# Patient Record
Sex: Male | Born: 1940 | Race: White | Hispanic: No | Marital: Married | Smoking: Never smoker
Health system: Southern US, Community
[De-identification: ages and names within clinical notes are randomized; demographics above are authoritative.]

## PROBLEM LIST (undated history)

## (undated) DIAGNOSIS — F32A Depression, unspecified: Secondary | ICD-10-CM

## (undated) DIAGNOSIS — F419 Anxiety disorder, unspecified: Secondary | ICD-10-CM

## (undated) DIAGNOSIS — G20A1 Parkinson's disease without dyskinesia, without mention of fluctuations: Secondary | ICD-10-CM

## (undated) DIAGNOSIS — F329 Major depressive disorder, single episode, unspecified: Secondary | ICD-10-CM

## (undated) DIAGNOSIS — G20 Parkinson's disease: Secondary | ICD-10-CM

## (undated) DIAGNOSIS — I10 Essential (primary) hypertension: Secondary | ICD-10-CM

## (undated) HISTORY — DX: Major depressive disorder, single episode, unspecified: F32.9

## (undated) HISTORY — PX: BACK SURGERY: SHX140

## (undated) HISTORY — PX: HERNIA REPAIR: SHX51

## (undated) HISTORY — DX: Depression, unspecified: F32.A

## (undated) HISTORY — DX: Essential (primary) hypertension: I10

## (undated) HISTORY — DX: Anxiety disorder, unspecified: F41.9

---

## 2000-01-26 ENCOUNTER — Encounter: Payer: Self-pay | Admitting: General Surgery

## 2000-01-26 ENCOUNTER — Encounter: Admission: RE | Admit: 2000-01-26 | Discharge: 2000-01-26 | Payer: Self-pay | Admitting: General Surgery

## 2000-02-02 ENCOUNTER — Ambulatory Visit (HOSPITAL_BASED_OUTPATIENT_CLINIC_OR_DEPARTMENT_OTHER): Admission: RE | Admit: 2000-02-02 | Discharge: 2000-02-02 | Payer: Self-pay | Admitting: General Surgery

## 2002-02-16 ENCOUNTER — Ambulatory Visit (HOSPITAL_COMMUNITY): Admission: RE | Admit: 2002-02-16 | Discharge: 2002-02-16 | Payer: Self-pay | Admitting: Gastroenterology

## 2002-03-07 ENCOUNTER — Encounter: Payer: Self-pay | Admitting: Cardiology

## 2002-03-07 ENCOUNTER — Ambulatory Visit (HOSPITAL_COMMUNITY): Admission: RE | Admit: 2002-03-07 | Discharge: 2002-03-07 | Payer: Self-pay | Admitting: Cardiology

## 2006-06-23 ENCOUNTER — Emergency Department (HOSPITAL_COMMUNITY): Admission: EM | Admit: 2006-06-23 | Discharge: 2006-06-23 | Payer: Self-pay | Admitting: Emergency Medicine

## 2007-11-16 ENCOUNTER — Encounter: Admission: RE | Admit: 2007-11-16 | Discharge: 2007-11-16 | Payer: Self-pay | Admitting: Family Medicine

## 2009-03-29 ENCOUNTER — Emergency Department (HOSPITAL_COMMUNITY): Admission: EM | Admit: 2009-03-29 | Discharge: 2009-03-29 | Payer: Self-pay | Admitting: Emergency Medicine

## 2010-11-15 LAB — URINALYSIS, ROUTINE W REFLEX MICROSCOPIC
Bilirubin Urine: NEGATIVE
Glucose, UA: NEGATIVE mg/dL
Ketones, ur: NEGATIVE mg/dL
Nitrite: NEGATIVE
Protein, ur: NEGATIVE mg/dL
Specific Gravity, Urine: 1.017 (ref 1.005–1.030)
Urobilinogen, UA: 0.2 mg/dL (ref 0.0–1.0)
pH: 6 (ref 5.0–8.0)

## 2010-11-15 LAB — URINE MICROSCOPIC-ADD ON

## 2010-12-26 NOTE — Op Note (Signed)
[place]  Patient:    Troy Cox, Troy Cox                          MRN: [id number] Proc. Date: 02/02/00 Attending:  Jimmye Norman, M.D.                           Operative Report  PREOPERATIVE DIAGNOSIS:  Bilateral inguinal hernias.  POSTOPERATIVE DIAGNOSIS:  Bilateral direct inguinal hernias.  PROCEDURE:  Bilateral laparoscopic inguinal herniorrhaphies with Marlex mesh.  SURGEON:  Jimmye Norman, M.D.  ASSISTANT:  Dr. ______.  ANESTHESIA:  General endotracheal.  ESTIMATED BLOOD LOSS:  Less than 30 cc.  COMPLICATIONS:  None.  CONDITION:  Stable.  INDICATIONS:  The patient is a 70 year old gentleman known to have a bulge in his right groin and also in his left groin on recent examination, who now comes in for a bilateral laparoscopic inguinal herniorrhaphy.  FINDINGS:  The patient had bilateral inguinal hernias, both of direct type with large defects that were covered with mesh.  OPERATION:  The patient was taken to the operating room, placed on table in supine position.  After an adequate general endotracheal anesthetic was administered, he was prepped and draped in usual sterile manner exposing the midline of the abdomen.  A transverse incision was made at the level of the umbilicus down through the subcu, the anterior rectus sheath and then we split the rectus muscle down to the posterior rectus sheath.  We subsequently used a balloon dissector to pass along the posterior rectus sheath down to the pubic tubercle on the right side.  We insufflated the balloon using the ______ device and as we were doing so, could see the dissection plane between the peritoneum and the anterior abdominal wall.  The preperitoneal space was opened up nicely as we could see Coopers ligament on both sides.  We subsequently removed the dissecting balloon and placed an ______ balloon catheter into the preperitoneal space, injected its retention balloon and then  insufflated with carbon dioxide gas.  We used the laparoscope with attached camera and light source to confirm we were in adequate preperitoneal space and we were.  Subsequently, a 5 mm cannula was placed in the midline between the umbilicus and the pubic crest and then a suprapubic cannula of the same size passed. These were both done under direct vision.  Once ______ were in place, the patient was placed in Trendelenburg position and the dissection was begun.  The hernia sac could be seen tented up against the peritoneum.  We dissected off this peritoneum and the hernia sac ______ protruding back into its large direct space.  We subsequently dissected out the spermatic cord on the right side, identifying its structures laterally.  Coopers ligament was adequately dissected out.  Once we had this dissection performed, we cut out a large piece of mesh measuring approximately 5 to 5.5 cm x 4.5 cm in an oval shaped pattern.  We actually dissected out both hernia sacs and hernia defects prior to placing any mesh.  The left side was dissected out in a manner similar to the right.  There was no peritoneal tearing.  We did strip down the peritoneum from the spermatic cord bilaterally and we encircled both spermatic cords completely.  We used the pieces of mesh, which were approximately from 5.5 x 4.5 cm in size onto and above the defects anteriorly and superiorly.  We tacked it down using a circular tacking stapler.  It was tacked down at the symphysis pubis, at [facility] ligament and then anterior abdominal wall above the level of the defect.  Laterally, it was taken up high with a single stapler.  Tacking on both sides was similar.  We irrigated with a small amount of saline.  However, we did soak the mesh in antibiotic solution prior to cutting it out and placing it in the patient.  Once both pieces of mesh were tacked in place we observed as we let the carbon dioxide gas fall out, there was  no actual bulge subsequently.  The mesh laid down perfectly.  We removed all cannulae and gas.  We repaired the periumbilical fascial defect using a running 0 Vicryl stitch.  Once this was done, we injected all incisions with 0.25% Marcaine with epinephrine and then subcuticular 5-0 Vicryl was used to close the skin.  All needle counts, sponge counts and instrument counts were correct.  Sterile dressings were applied. DD:  02/02/00 TD:  02/03/00 Job: [id number]

## 2010-12-26 NOTE — Procedures (Signed)
[place]. [facility]  Patient:    Troy Cox, Troy Cox Visit Number: [id number] MRN: [id number]          Service Type: Attending:  Verlin Grills, M.D. Dictated by:   Verlin Grills, M.D. Proc. Date: 02/16/02   CC:         Abran Cantor. Clovis Riley, M.D. Lupe Carney)   Procedure Report  REFERRING PHYSICIAN:  Abran Cantor. Clovis Riley, M.D.  PROCEDURE:  Colonoscopy.  PROCEDURE INDICATION:  The patient is a 70 year old male born 04-04-1941. The patient is undergoing diagnostic colonoscopy to evaluate guaiac-positive stool.  ENDOSCOPIST:  Verlin Grills, M.D.  PREMEDICATION: 1. Versed 7.5 mg. 2. Fentanyl 50 mcg.  ENDOSCOPE:  Olympus pediatric colonoscope.  DESCRIPTION OF PROCEDURE:  After obtaining informed consent, the patient was placed in the left lateral decubitus position.  I administered intravenous fentanyl and intravenous Versed to achieve conscious sedation for the procedure.  The patients blood pressure, oxygen saturation and cardiac rhythm were monitored throughout the procedure; documented in the medical record.  Anal inspection was normal.  Digital rectal exam revealed an enlarged but nonnodular prostate.  The Olympus pediatric video colonoscope was introduced into the rectum and easily advanced to the cecum.  A normal-appearing ileocecal valve was intubated and the distal ileum inspected.  Colonic preparation for the exam today was excellent.  Rectum normal.  Sigmoid colon and descending colon normal.  Splenic flexure normal.  Transverse colon normal.  Hepatic flexure normal.  Ascending colon normal.  Cecum and ileocecal valve normal.  Distal ileum normal.  ASSESSMENT:  Normal proctocolonoscopy to the cecum, with ileocecal valve intubation and distal ileal inspection. Dictated by:   Verlin Grills, M.D. Attending:  Verlin Grills, M.D. DD:  02/16/02 TD:  02/20/02 Job: [id number]

## 2011-12-31 DIAGNOSIS — B351 Tinea unguium: Secondary | ICD-10-CM | POA: Diagnosis not present

## 2011-12-31 DIAGNOSIS — Z1211 Encounter for screening for malignant neoplasm of colon: Secondary | ICD-10-CM | POA: Diagnosis not present

## 2011-12-31 DIAGNOSIS — Z79899 Other long term (current) drug therapy: Secondary | ICD-10-CM | POA: Diagnosis not present

## 2011-12-31 DIAGNOSIS — D1800 Hemangioma unspecified site: Secondary | ICD-10-CM | POA: Diagnosis not present

## 2011-12-31 DIAGNOSIS — M159 Polyosteoarthritis, unspecified: Secondary | ICD-10-CM | POA: Diagnosis not present

## 2011-12-31 DIAGNOSIS — E782 Mixed hyperlipidemia: Secondary | ICD-10-CM | POA: Diagnosis not present

## 2011-12-31 DIAGNOSIS — Z0000 Encounter for general adult medical examination without abnormal findings: Secondary | ICD-10-CM | POA: Diagnosis not present

## 2011-12-31 DIAGNOSIS — Z125 Encounter for screening for malignant neoplasm of prostate: Secondary | ICD-10-CM | POA: Diagnosis not present

## 2012-01-07 DIAGNOSIS — D239 Other benign neoplasm of skin, unspecified: Secondary | ICD-10-CM | POA: Diagnosis not present

## 2012-01-07 DIAGNOSIS — D1801 Hemangioma of skin and subcutaneous tissue: Secondary | ICD-10-CM | POA: Diagnosis not present

## 2012-01-07 DIAGNOSIS — D1800 Hemangioma unspecified site: Secondary | ICD-10-CM | POA: Diagnosis not present

## 2012-05-05 DIAGNOSIS — Z23 Encounter for immunization: Secondary | ICD-10-CM | POA: Diagnosis not present

## 2012-06-09 DIAGNOSIS — K5730 Diverticulosis of large intestine without perforation or abscess without bleeding: Secondary | ICD-10-CM | POA: Diagnosis not present

## 2012-06-09 DIAGNOSIS — D126 Benign neoplasm of colon, unspecified: Secondary | ICD-10-CM | POA: Diagnosis not present

## 2012-06-09 DIAGNOSIS — Z1211 Encounter for screening for malignant neoplasm of colon: Secondary | ICD-10-CM | POA: Diagnosis not present

## 2013-01-04 DIAGNOSIS — E782 Mixed hyperlipidemia: Secondary | ICD-10-CM | POA: Diagnosis not present

## 2013-01-04 DIAGNOSIS — M159 Polyosteoarthritis, unspecified: Secondary | ICD-10-CM | POA: Diagnosis not present

## 2013-01-04 DIAGNOSIS — Z23 Encounter for immunization: Secondary | ICD-10-CM | POA: Diagnosis not present

## 2013-01-04 DIAGNOSIS — R030 Elevated blood-pressure reading, without diagnosis of hypertension: Secondary | ICD-10-CM | POA: Diagnosis not present

## 2013-01-04 DIAGNOSIS — Z79899 Other long term (current) drug therapy: Secondary | ICD-10-CM | POA: Diagnosis not present

## 2013-01-04 DIAGNOSIS — Z0000 Encounter for general adult medical examination without abnormal findings: Secondary | ICD-10-CM | POA: Diagnosis not present

## 2013-01-04 DIAGNOSIS — H9190 Unspecified hearing loss, unspecified ear: Secondary | ICD-10-CM | POA: Diagnosis not present

## 2013-04-05 DIAGNOSIS — H9190 Unspecified hearing loss, unspecified ear: Secondary | ICD-10-CM | POA: Diagnosis not present

## 2013-04-05 DIAGNOSIS — I10 Essential (primary) hypertension: Secondary | ICD-10-CM | POA: Diagnosis not present

## 2013-04-05 DIAGNOSIS — R498 Other voice and resonance disorders: Secondary | ICD-10-CM | POA: Diagnosis not present

## 2013-04-11 DIAGNOSIS — H903 Sensorineural hearing loss, bilateral: Secondary | ICD-10-CM | POA: Diagnosis not present

## 2013-04-21 DIAGNOSIS — H903 Sensorineural hearing loss, bilateral: Secondary | ICD-10-CM | POA: Diagnosis not present

## 2013-05-31 DIAGNOSIS — Z23 Encounter for immunization: Secondary | ICD-10-CM | POA: Diagnosis not present

## 2014-01-19 DIAGNOSIS — Z0000 Encounter for general adult medical examination without abnormal findings: Secondary | ICD-10-CM | POA: Diagnosis not present

## 2014-01-19 DIAGNOSIS — E782 Mixed hyperlipidemia: Secondary | ICD-10-CM | POA: Diagnosis not present

## 2014-01-19 DIAGNOSIS — I10 Essential (primary) hypertension: Secondary | ICD-10-CM | POA: Diagnosis not present

## 2014-01-19 DIAGNOSIS — M159 Polyosteoarthritis, unspecified: Secondary | ICD-10-CM | POA: Diagnosis not present

## 2014-01-19 DIAGNOSIS — Z23 Encounter for immunization: Secondary | ICD-10-CM | POA: Diagnosis not present

## 2014-05-17 DIAGNOSIS — Z23 Encounter for immunization: Secondary | ICD-10-CM | POA: Diagnosis not present

## 2015-01-21 DIAGNOSIS — E780 Pure hypercholesterolemia: Secondary | ICD-10-CM | POA: Diagnosis not present

## 2015-01-21 DIAGNOSIS — Z0000 Encounter for general adult medical examination without abnormal findings: Secondary | ICD-10-CM | POA: Diagnosis not present

## 2015-01-21 DIAGNOSIS — M150 Primary generalized (osteo)arthritis: Secondary | ICD-10-CM | POA: Diagnosis not present

## 2015-01-21 DIAGNOSIS — I10 Essential (primary) hypertension: Secondary | ICD-10-CM | POA: Diagnosis not present

## 2015-06-06 DIAGNOSIS — Z23 Encounter for immunization: Secondary | ICD-10-CM | POA: Diagnosis not present

## 2016-02-10 DIAGNOSIS — K219 Gastro-esophageal reflux disease without esophagitis: Secondary | ICD-10-CM | POA: Diagnosis not present

## 2016-02-10 DIAGNOSIS — R490 Dysphonia: Secondary | ICD-10-CM | POA: Diagnosis not present

## 2016-02-10 DIAGNOSIS — D489 Neoplasm of uncertain behavior, unspecified: Secondary | ICD-10-CM | POA: Diagnosis not present

## 2016-02-10 DIAGNOSIS — R079 Chest pain, unspecified: Secondary | ICD-10-CM | POA: Diagnosis not present

## 2016-02-10 DIAGNOSIS — M150 Primary generalized (osteo)arthritis: Secondary | ICD-10-CM | POA: Diagnosis not present

## 2016-02-10 DIAGNOSIS — Z0000 Encounter for general adult medical examination without abnormal findings: Secondary | ICD-10-CM | POA: Diagnosis not present

## 2016-02-10 DIAGNOSIS — E7800 Pure hypercholesterolemia, unspecified: Secondary | ICD-10-CM | POA: Diagnosis not present

## 2016-02-10 DIAGNOSIS — I10 Essential (primary) hypertension: Secondary | ICD-10-CM | POA: Diagnosis not present

## 2016-02-21 DIAGNOSIS — L820 Inflamed seborrheic keratosis: Secondary | ICD-10-CM | POA: Diagnosis not present

## 2016-04-28 DIAGNOSIS — Z23 Encounter for immunization: Secondary | ICD-10-CM | POA: Diagnosis not present

## 2016-08-18 DIAGNOSIS — H2513 Age-related nuclear cataract, bilateral: Secondary | ICD-10-CM | POA: Diagnosis not present

## 2016-09-10 DIAGNOSIS — G20 Parkinson's disease: Secondary | ICD-10-CM | POA: Diagnosis not present

## 2016-09-24 ENCOUNTER — Ambulatory Visit: Payer: Medicare Other | Attending: Pharmacist | Admitting: Physical Therapy

## 2016-09-24 DIAGNOSIS — R2689 Other abnormalities of gait and mobility: Secondary | ICD-10-CM | POA: Insufficient documentation

## 2016-09-24 DIAGNOSIS — R293 Abnormal posture: Secondary | ICD-10-CM | POA: Diagnosis not present

## 2016-09-24 DIAGNOSIS — R2681 Unsteadiness on feet: Secondary | ICD-10-CM | POA: Insufficient documentation

## 2016-09-24 NOTE — Therapy (Signed)
[facility] [address] [place], [place], [address] Phone: [phone]   Fax:  [phone]  Physical Therapy Evaluation  Patient Details  Name: Troy Cox MRN: [id number] Date of Birth: 11/28/1940 Referring Provider: Sanjay, Iyer ([facility])  Encounter Date: 09/24/2016              PT End of Session - 09/24/16 2214    Visit Number 1   Number of Visits 16   Date for PT Re-Evaluation 11/23/16   Authorization Type Medicare primary; [employer] 2nd; (GCODE every 10th visit)   PT Start Time 0804   PT Stop Time 0852   PT Time Calculation (min) 48 min   Activity Tolerance Patient tolerated treatment well   Behavior During Therapy WFL for tasks assessed/performed      No past medical history on file.  No past surgical history on file.  There were no vitals filed for this visit.               Subjective Assessment - 09/24/16 0807    Subjective Pt reports noticing several physical symptoms, including hand trembling (approx 1 year ago); he also notes slowed walking, forward posture, history of back pain.  He has used cane for approx 1 month.  Pt has had one fall (forward, trying to get the newspaper)   Patient is accompained by: Family member  wife, Jeannee   Pertinent History history of back pain   Patient Stated Goals Pt's goal for therapy is to improve walking.   Currently in Pain? Yes   Pain Score 3    Pain Location Back   Pain Orientation Lower   Pain Descriptors / Indicators --  consistent   Pain Type Chronic pain   Pain Frequency Constant   Aggravating Factors  unsure what aggravates   Pain Relieving Factors Medications alleviate    PT will monitor pain, but will not address as a goal at this time, as pain is chronic in nature.              [facility] PT Assessment - 09/24/16 0819            Assessment   Medical Diagnosis Parkinson's disease   Referring  Provider Sanjay, Iyer  [facility]   Onset Date/Surgical Date 09/11/16       Precautions   Precautions Fall       Balance Screen   Has the patient fallen in the past 6 months Yes   How many times? 1   Has the patient had a decrease in activity level because of a fear of falling?  No   Is the patient reluctant to leave their home because of a fear of falling?  No       Home Environment   Living Environment Private residence   Living Arrangements Spouse/significant other   Type of Home House   Home Access Stairs to enter   Entrance Stairs-Number of Steps 5   Entrance Stairs-Rails Right   Home Layout Multi-level  Office on top floor   Home Equipment Cane - single point       Prior Function   Level of Independence Independent with community mobility without device;Independent with household mobility without device  Needs assist with coats   Leisure Enjoys walking in neighborhood, yardwork       Observation/Other Assessments   Focus on Therapeutic Outcomes (FOTO)  NA       Posture/Postural Control   Posture/Postural Control   Postural limitations   Postural Limitations Rounded Shoulders;Forward head       Tone   Assessment Location Right Lower Extremity       ROM / Strength   AROM / PROM / Strength AROM;Strength       AROM   Overall AROM Comments WFL, with possible hamstring tightness noted with upright standing       Strength   Overall Strength Comments Grossly tested at least 4 to 5/5 bilateral lower extremities-hip flexion, quads, hamstrings, ankle dorsiflexion       Transfers   Transfers Sit to Stand;Stand to Sit   Sit to Stand 5: Supervision;Without upper extremity assist;From chair/3-in-1   Five time sit to stand comments  28.22   Stand to Sit 5: Supervision;Without upper extremity assist;To chair/3-in-1   Comments Upon standing, pt does not fully extend knees, has forward flexed posture       Ambulation/Gait    Ambulation/Gait Yes   Ambulation/Gait Assistance 5: Supervision;4: Min guard   Ambulation/Gait Assistance Details Pt appears to have several episodes of whole body type tremor, with LOB upon standing, needing therapist's assist to regain balance.   Ambulation Distance (Feet) 200 Feet   Assistive device Straight cane  but does not use consistently during eval   Gait Pattern Step-through pattern;Decreased arm swing - right;Decreased step length - right;Shuffle;Trendelenburg;Lateral trunk lean to right;Decreased trunk rotation   Ambulation Surface Level;Indoor   Gait velocity 14.94 sec (2.2 ft/sec)  holds cane, 15.75 sec (2.08 ft/sec)no device       Standardized Balance Assessment   Standardized Balance Assessment Timed Up and Go Test       Timed Up and Go Test   Normal TUG (seconds) 15.97   Manual TUG (seconds) 15.13   Cognitive TUG (seconds) 14.03   TUG Comments Scores >13.5-15 sec indicate increased fall risk.       High Level Balance   High Level Balance Comments Initiated MiniBESTest, but did not complete due to time constraints of eval.  Posterior push and release:  pt takes 2 small steps to recover.  Forward push and release-pt has extended time of lean forward, but does not take recovery step.       RLE Tone   RLE Tone Mild                  [facility] Adult PT Treatment/Exercise - 09/24/16 0819            Self-Care   Self-Care Other Self-Care Comments   Other Self-Care Comments  Based on pt's reports of some motor fluctuations during the course of the day, discovered pt is taking Sinemet with meals.  Provided education on medication management that pt should take Sinemet >30 minutes before or 1-2 hours after meals.  Referred additional questions regarding medication to neurologist..                  PT Education - 09/24/16 2212    Education provided Yes   Education Details Discussed POC, including PWR! Moves for large amplitude  movement training in functional context; medication education regarding Sinemet (avoid taking at meal times)   Person(s) Educated Patient;Spouse   Methods Explanation   Comprehension Verbalized understanding                  PT Short Term Goals - 09/24/16 2225            PT SHORT TERM GOAL #1   Title Pt will perform HEP with   wife's supervision for improved transfers, balance and gait.  TARGET 10/24/15   Time 4   Period Weeks   Status New       PT SHORT TERM GOAL #2   Title Pt will improve 5x sit<>stand to less than or equal to 20 seconds for decreased fall risk.   Time 4   Period Weeks   Status New       PT SHORT TERM GOAL #3   Title Pt will improve TUG score to less than or equal to 13.5 seconds for decreased fall risk.   Time 4   Period Weeks   Status New       PT SHORT TERM GOAL #4   Title MiniBESTest to be performed, with goal to be written as appropriate.   Time 4   Period Weeks   Status New       PT SHORT TERM GOAL #5   Title Pt/wife will verbalize understanding of local Parkinson's disease resources.   Time 4   Period Weeks   Status New                  PT Long Term Goals - 09/24/16 2232            PT LONG TERM GOAL #1   Title Pt/wife will verbalize understanding of fall prevention in home environment.  TARGET 11/23/16   Time 8   Period Weeks   Status New       PT LONG TERM GOAL #2   Title Pt will improve 5x sit<>stand test to less than or equal to 15 seconds for improved efficiency/safety with transfers.   Time 8   Period Weeks   Status New       PT LONG TERM GOAL #3   Title Pt will improve gait velocity to at least 2.62 ft/sec for improved gait efficiency and safety.   Time 8   Period Weeks   Status New       PT LONG TERM GOAL #4   Title Pt will ambulate at least 1000 ft, indoor and outdoor surfaces, modified independently, no LOB, for improved community gait.     Time 8    Period Weeks   Status New       PT LONG TERM GOAL #5   Title Pt will verbalize plans for continued community fitness upon D/C from PT.   Time 8   Period Weeks   Status New                  Plan - 09/24/16 2216    Clinical Impression Statement Pt is a 75 year old male who presents to [facility] with recent diagnosis of Parkinson's disease.  He presents with postural instability, decreased balance, bradykinesia, tremors, decreased functional strength with slowed transfers, decreased timing and coordination of gait.  Pt is at fall risk per TUG scores and demonstrates poor step strategy for balance with push and release tests.  Pt's presentation is evolving, as a newly diagnosed PD patient.  Pt would benefit from skilled physical therapy to address the above stated deficits to improve functional mobility and decrease fall risk.   Rehab Potential Good   PT Frequency 2x / week   PT Duration 8 weeks  plus eval   PT Treatment/Interventions ADLs/Self Care Home Management;Functional mobility training;Gait training;DME Instruction;Patient/family education;Neuromuscular re-education;Balance training;Therapeutic exercise;Therapeutic activities   PT Next Visit Plan Complete MiniBESTest, provide info on Power over Parkinson's; work on sit<>stand transfers, Initiate PWR!   Moves   Recommended Other Services Ask about OT, speech therapy evals   Consulted and Agree with Plan of Care Patient;Family member/caregiver      Patient will benefit from skilled therapeutic intervention in order to improve the following deficits and impairments:  Abnormal gait, Decreased balance, Decreased mobility, Decreased strength, Difficulty walking, Postural dysfunction  Visit Diagnosis: Other abnormalities of gait and mobility  Unsteadiness on feet  Abnormal posture          G-Codes - 09/24/16 2238    Functional Assessment Tool Used 5x sit<>stand 28.22 sec, gait velocity 2.2 ft/sec  carrying cane, 2.08 ft/sec no device; TUG 15.79 sec, TUG man 15.13, TUG cog 14.03 sec; 1 fall in past 6 months   Functional Limitation Mobility: Walking and moving around   Mobility: Walking and Moving Around Current Status (G8978) At least 40 percent but less than 60 percent impaired, limited or restricted   Mobility: Walking and Moving Around Goal Status (G8979) At least 20 percent but less than 40 percent impaired, limited or restricted       Problem List There are no active problems to display for this patient.   Ashleyanne Hemmingway W. 09/24/2016, 10:50 PM  Krisa Blattner W., PT  [facility] [address] [place], [place], [address] Phone: [phone]   Fax:  [phone]  Name: Samier N Carvey MRN: [id number] Date of Birth: 12/23/1940     

## 2016-09-25 ENCOUNTER — Ambulatory Visit: Payer: Medicare Other | Admitting: Physical Therapy

## 2016-09-25 DIAGNOSIS — R2681 Unsteadiness on feet: Secondary | ICD-10-CM

## 2016-09-25 DIAGNOSIS — R2689 Other abnormalities of gait and mobility: Secondary | ICD-10-CM

## 2016-09-25 DIAGNOSIS — R293 Abnormal posture: Secondary | ICD-10-CM

## 2016-09-25 NOTE — Therapy (Signed)
[facility] [address] [place], [place], [address] Phone: [phone]   Fax:  [phone]  Physical Therapy Treatment  Patient Details  Name: Troy Cox MRN: [id number] Date of Birth: 1941/04/04 Referring Provider: Jeannie Fend [facility])  Encounter Date: 09/25/2016      PT End of Session - 09/25/16 1403    Visit Number 2   Number of Visits 16   Date for PT Re-Evaluation 11/23/16   Authorization Type Medicare primary; [employer] 2nd; (GCODE every 10th visit)   PT Start Time 0801   PT Stop Time 0845   PT Time Calculation (min) 44 min   Activity Tolerance Patient tolerated treatment well   Behavior During Therapy [facility] for tasks assessed/performed      No past medical history on file.  No past surgical history on file.  There were no vitals filed for this visit.      Subjective Assessment - 09/25/16 0802    Subjective Nothing new this morning, other than I forgot my cane.  Took my Sinemet not with food this morning.   Patient is accompained by: Family member  wife, [name]   Pertinent History history of back pain   Patient Stated Goals Pt's goal for therapy is to improve walking.   Currently in Pain? No/denies                         [facility] Adult PT Treatment/Exercise - 09/25/16 0811      Transfers   Transfers Sit to Stand;Stand to Sit   Sit to Stand 5: Supervision;Without upper extremity assist;From elevated surface;From chair/3-in-1   Sit to Stand Details (indicate cue type and reason) Cues provided for sit<>stand technique, for improved ease of transfers   Stand to Sit 5: Supervision;Without upper extremity assist;To elevated surface;To chair/3-in-1   Number of Reps Other reps (comment);Other sets (comment)  10 reps from 22" mat, then 10 reps from 18" chair   Comments Cues for proper technique for sit<>stand     Ambulation/Gait   Ambulation/Gait Yes   Ambulation/Gait Assistance  5: Supervision   Ambulation/Gait Assistance Details Cues for increased step length, increased arm swing and upright posture.   Ambulation Distance (Feet) 400 Feet   Assistive device None   Gait Pattern Decreased step length - right;Decreased step length - left;Step-through pattern;Decreased arm swing - right;Decreased arm swing - left;Trunk flexed;Decreased trunk rotation  Improved step length and foot clearance   Ambulation Surface Level;Indoor     High Level Balance   High Level Balance Comments MiniBESTest score:  19/28     Self-Care   Self-Care Other Self-Care Comments   Other Self-Care Comments  Reviewed/discussed information from Doreene Adas Every Victory Counts booklet regarding avoiding taking Sinemet with high protein meals; provided patient with info on [employer] over Parkinson's group.  Discussed fall risk per MiniBESTest score           PWR [facility]) - 09/25/16 0837    PWR! exercises Moves in sitting   PWR! Up x 20   PWR! Rock x 20   PWR! Twist x 20   PWR! Step x20   Comments Verbal, visual, tactile cues provided for intensity, technique      Mini-BESTest: Balance Evaluation Systems Test  2005-2013 [facility]. All rights reserved. ________________________________________________________________________________________Anticipatory_________Subscore____4_/6 1. SIT TO STAND Instruction: "Cross your arms across your chest. Try not to use your hands unless you must.Do not let your  legs lean against the back of the chair when you stand. Please stand up now." X(2) Normal: Comes to stand without use of hands and stabilizes independently. (1) Moderate: Comes to stand WITH use of hands on first attempt. (0) Severe: Unable to stand up from chair without assistance, OR needs several attempts with use of hands. 2. RISE TO TOES Instruction: "Place your feet shoulder width apart. Place your hands on your hips. Try to rise as high as you can onto your toes. I  will count out loud to 3 seconds. Try to hold this pose for at least 3 seconds. Look straight ahead. Rise now." (2) Normal: Stable for 3 s with maximum height. X(1) Moderate: Heels up, but not full range (smaller than when holding hands), OR noticeable instability for 3 s. (0) Severe: < 3 s. 3. STAND ON ONE LEG Instruction: "Look straight ahead. Keep your hands on your hips. Lift your leg off of the ground behind you without touching or resting your raised leg upon your other standing leg. Stay standing on one leg as long as you can. Look straight ahead. Lift now." Left: Time in Seconds Trial 1:__6.53___Trial 2:__6.35___ (2) Normal: 20 s. X(1) Moderate: < 20 s. (0) Severe: Unable. Right: Time in Seconds Trial 1:_6.55____Trial 2:_17.06____ (2) Normal: 20 s. (1) Moderate: < 20 s. (0) Severe: Unable To score each side separately use the trial with the longest time. To calculate the sub-score and total score use the side [left or right] with the lowest numerical score [i.e. the worse side]. ______________________________________________________________________________________Reactive Postural Control___________Subscore:__4___/6 4. COMPENSATORY STEPPING CORRECTION- FORWARD Instruction: "Stand with your feet shoulder width apart, arms at your sides. Lean forward against my hands beyond your forward limits. When I let go, do whatever is necessary, including taking a step, to avoid a fall." X(2) Normal: Recovers independently with a single, large step (second realignment step is allowed). (1) Moderate: More than one step used to recover equilibrium. (0) Severe: No step, OR would fall if not caught, OR falls spontaneously. 5. COMPENSATORY STEPPING CORRECTION- BACKWARD Instruction: "Stand with your feet shoulder width apart, arms at your sides. Lean backward against my hands beyond your backward limits. When I let go, do whatever is necessary, including taking a step, to avoid a fall." (2) Normal:  Recovers independently with a single, large step. X(1) Moderate: More than one step used to recover equilibrium. (0) Severe: No step, OR would fall if not caught, OR falls spontaneously. 6. COMPENSATORY STEPPING CORRECTION- LATERAL Instruction: "Stand with your feet together, arms down at your sides. Lean into my hand beyond your sideways limit. When I let go, do whatever is necessary, including taking a step, to avoid a fall." Left (2) Normal: Recovers independently with 1 step (crossover or lateral OK). X(1) Moderate: Several steps to recover equilibrium. (0) Severe: Falls, or cannot step. Right (2) Normal: Recovers independently with 1 step (crossover or lateral OK). X(1) Moderate: Several steps to recover equilibrium. (0) Severe: Falls, or cannot step. Use the side with the lowest score to calculate sub-score and total score. ____________________________________________________________________________________Sensory Orientation_____________Subscore:_____6____/6 7. STANCE (FEET TOGETHER); EYES OPEN, FIRM SURFACE Instruction: "Place your hands on your hips. Place your feet together until almost touching. Look straight ahead. Be as stable and still as possible, until I say stop." Time in seconds:________ X(2) Normal: 30 s. (1) Moderate: < 30 s. (0) Severe: Unable. 8. STANCE (FEET TOGETHER); EYES CLOSED, FOAM SURFACE Instruction: "Step onto the foam. Place your hands on your hips. Place your feet  together until almost touching. Be as stable and still as possible, until I say stop. I will start timing when you close your eyes." Time in seconds:________ X(2) Normal: 30 s. (1) Moderate: < 30 s. (0) Severe: Unable. 9. INCLINE- EYES CLOSED Instruction: "Step onto the incline ramp. Please stand on the incline ramp with your toes toward the top. Place your feet shoulder width apart and have your arms down at your sides. I will start timing when you close your eyes." Time in  seconds:________ X(2) Normal: Stands independently 30 s and aligns with gravity. (1) Moderate: Stands independently <30 s OR aligns with surface. (0) Severe: Unable. _________________________________________________________________________________________Dynamic Gait ______Subscore____5____/10 10. CHANGE IN GAIT SPEED Instruction: "Begin walking at your normal speed, when I tell you 'fast', walk as fast as you can. When I say 'slow', walk very slowly." (2) Normal: Significantly changes walking speed without imbalance. X(1) Moderate: Unable to change walking speed or signs of imbalance. (0) Severe: Unable to achieve significant change in walking speed AND signs of imbalance. [place] - HORIZONTAL Instruction: "Begin walking at your normal speed, when I say "right", turn your head and look to the right. When I say "left" turn your head and look to the left. Try to keep yourself walking in a straight line." (2) Normal: performs head turns with no change in gait speed and good balance. X(1) Moderate: performs head turns with reduction in gait speed. (0) Severe: performs head turns with imbalance. 12. WALK WITH PIVOT TURNS Instruction: "Begin walking at your normal speed. When I tell you to 'turn and stop', turn as quickly as you can, face the opposite direction, and stop. After the turn, your feet should be close together." (2) Normal: Turns with feet close FAST (< 3 steps) with good balance. (1) Moderate: Turns with feet close SLOW (>4 steps) with good balance. X(0) Severe: Cannot turn with feet close at any speed without imbalance.-pivots, decreased foot clearance 13. STEP OVER OBSTACLES Instruction: "Begin walking at your normal speed. When you get to the box, step over it, not around it and keep walking." (2) Normal: Able to step over box with minimal change of gait speed and with good balance. X(1) Moderate: Steps over box but touches box OR displays cautious behavior by  slowing gait. (0) Severe: Unable to step over box OR steps around box. 14. TIMED UP & GO WITH DUAL TASK [3 METER WALK] Instruction TUG: "When I say 'Go', stand up from chair, walk at your normal speed across the tape on the floor, turn around, and come back to sit in the chair." Instruction TUG with Dual Task: "Count backwards by threes starting at ___. When I say 'Go', stand up from chair, walk at your normal speed across the tape on the floor, turn around, and come back to sit in the chair. Continue counting backwards the entire time." TUG: ________seconds; Dual Task TUG: ________seconds X(2) Normal: No noticeable change in sitting, standing or walking while backward counting when compared to TUG without Dual Task. (1) Moderate: Dual Task affects either counting OR walking (>10%) when compared to the TUG without Dual Task. (0) Severe: Stops counting while walking OR stops walking while counting. When scoring item 14, if subject's gait speed slows more than 10% between the TUG without and with a Dual Task the score should be decreased by a point. TOTAL SCORE: ________/28        PT Education - 09/25/16 1402    Education provided Yes  Education Details HEP-see instructions   Person(s) Educated Patient;Spouse   Methods Explanation;Demonstration;Handout;Verbal cues;Tactile cues   Comprehension Verbalized understanding;Returned demonstration;Need further instruction          PT Short Term Goals - 09/25/16 1408      PT SHORT TERM GOAL #1   Title Pt will perform HEP with wife's supervision for improved transfers, balance and gait.  TARGET 10/24/15   Time 4   Period Weeks   Status New     PT SHORT TERM GOAL #2   Title Pt will improve 5x sit<>stand to less than or equal to 20 seconds for decreased fall risk.   Time 4   Period Weeks   Status New     PT SHORT TERM GOAL #3   Title Pt will improve TUG score to less than or equal to 13.5 seconds for decreased fall risk.   Time 4    Period Weeks   Status New     PT SHORT TERM GOAL #4   Title MiniBESTest score to improve to at least 22/28 for decreased fall risk.   Time 4   Period Weeks   Status New     PT SHORT TERM GOAL #5   Title Pt/wife will verbalize understanding of local Parkinson's disease resources.   Time 4   Period Weeks   Status New           PT Long Term Goals - 09/24/16 2232      PT LONG TERM GOAL #1   Title Pt/wife will verbalize understanding of fall prevention in home environment.  TARGET 11/23/16   Time 8   Period Weeks   Status New     PT LONG TERM GOAL #2   Title Pt will improve 5x sit<>stand test to less than or equal to 15 seconds for improved efficiency/safety with transfers.   Time 8   Period Weeks   Status New     PT LONG TERM GOAL #3   Title Pt will improve gait velocity to at least 2.62 ft/sec for improved gait efficiency and safety.   Time 8   Period Weeks   Status New     PT LONG TERM GOAL #4   Title Pt will ambulate at least 1000 ft, indoor and outdoor surfaces, modified independently, no LOB, for improved community gait.     Time 8   Period Weeks   Status New     PT LONG TERM GOAL #5   Title Pt will verbalize plans for continued community fitness upon D/C from PT.   Time 8   Period Weeks   Status New               Plan - 09/25/16 1403    Clinical Impression Statement Skilled physical therapy session focused on sit<>stand transfer practice, initiation of HEP for large amplitude movements in sitting (PWR! Moves in sitting).  MiniBESTest completed today, with pt's score 19/28, indicating increased fall risk. Pt responds well to cues for large amplitude of movement with transfers, exercises and with short distance gait practice.   Rehab Potential Good   PT Frequency 2x / week   PT Duration 8 weeks  plus eval   PT Treatment/Interventions ADLs/Self Care Home Management;Functional mobility training;Gait training;DME Instruction;Patient/family  education;Neuromuscular re-education;Balance training;Therapeutic exercise;Therapeutic activities   PT Next Visit Plan Review PWR! Up and PWR! Rock in sitting, then add PWR! twist and step in sitting; review transfer training; Add PWR! Moves in standing to HEP; work  on gait with intensity  Ask about OT, speech evals-   PT Home Exercise Plan 09/25/16:  Added PWR! Up and Rock in sitting; sit<>stand transfers   Consulted and Agree with Plan of Care Patient;Family member/caregiver      Patient will benefit from skilled therapeutic intervention in order to improve the following deficits and impairments:  Abnormal gait, Decreased balance, Decreased mobility, Decreased strength, Difficulty walking, Postural dysfunction  Visit Diagnosis: Unsteadiness on feet  Abnormal posture  Other abnormalities of gait and mobility     Problem List There are no active problems to display for this patient.   Frazier Butt 09/25/2016, 2:09 PM Frazier Butt., PT [place] [address] [place], [place], [address] Phone: [phone]   Fax:  [phone]  Name: Troy Cox MRN: [id number] Date of Birth: [birth year]

## 2016-09-25 NOTE — Patient Instructions (Addendum)
    Instructions provided for PWR! Up and PWR! Rock in sitting for HEP-20 reps, once per day Sit to Stand Transfers:  1. Scoot out to the edge of the chair 2. Place your feet flat on the floor, shoulder width apart.  Make sure your feet are tucked just under your knees. 3. Lean forward (nose over toes) with momentum, and stand up tall with your best posture.  If you need to use your arms, use them as a quick boost up to stand. 4. If you are in a low or soft chair, you can lean back and then forward up to stand, in order to get more momentum. 5. Once you are standing, make sure you are looking ahead and standing tall.  To sit down:  1. Back up until you feel the chair behind your legs. 2. Bend at you hips, reaching  Back for you chair, if needed, then slowly squat to sit down on your chair.

## 2016-09-29 ENCOUNTER — Ambulatory Visit: Payer: Medicare Other | Admitting: Physical Therapy

## 2016-09-29 DIAGNOSIS — R293 Abnormal posture: Secondary | ICD-10-CM | POA: Diagnosis not present

## 2016-09-29 DIAGNOSIS — R2681 Unsteadiness on feet: Secondary | ICD-10-CM | POA: Diagnosis not present

## 2016-09-29 DIAGNOSIS — R2689 Other abnormalities of gait and mobility: Secondary | ICD-10-CM

## 2016-09-29 NOTE — Therapy (Signed)
[place] [address] [place], [place], [address] Phone: [phone]   Fax:  [phone]  Physical Therapy Treatment  Patient Details  Name: Troy Cox MRN: [id number] Date of Birth: [birth year] Referring Provider: Jeannie Fend [facility])  Encounter Date: 09/29/2016      PT End of Session - 09/29/16 1414    Visit Number 3   Number of Visits 16   Date for PT Re-Evaluation 11/23/16   Authorization Type Medicare primary; [employer] 2nd; (GCODE every 10th visit)   PT Start Time 0848   PT Stop Time 0933   PT Time Calculation (min) 45 min   Activity Tolerance Patient tolerated treatment well   Behavior During Therapy [facility] for tasks assessed/performed      No past medical history on file.  No past surgical history on file.  There were no vitals filed for this visit.      Subjective Assessment - 09/29/16 0851    Subjective Nothing new since last week.  Felt like yesterday was just a bad day.  Did the exercises, but had a little trouble.   Patient is accompained by: Family member  wife, Troy Cox   Pertinent History history of back pain   Patient Stated Goals Pt's goal for therapy is to improve walking.   Currently in Pain? No/denies                         [facility] Adult PT Treatment/Exercise - 09/29/16 0853      Transfers   Transfers Sit to Stand;Stand to Sit   Sit to Stand 6: Modified independent (Device/Increase time);Without upper extremity assist;From chair/3-in-1;From bed   Stand to Sit 6: Modified independent (Device/Increase time);Without upper extremity assist;To chair/3-in-1;To bed   Number of Reps 10 reps;Other sets (comment)  from 20", then 18" surfaces   Comments Initial cues for transfer technique     Ambulation/Gait   Ambulation/Gait Yes   Ambulation/Gait Assistance 5: Supervision   Ambulation/Gait Assistance Details Cues for increased step length, increased arm swing,  improved posture   Ambulation Distance (Feet) 800 Feet   Gait Pattern Decreased step length - right;Decreased step length - left;Step-through pattern;Decreased arm swing - right;Decreased arm swing - left;Trunk flexed;Decreased trunk rotation  Improves with cues   Ambulation Surface Level;Indoor   Pre-Gait Activities Forward/back walking x 30 ft, 4 reps, with cues for increased step length    Gait Comments Discussed variable types of cues that patient can notice, wife can notice; how wife can cue patient-discussed exaggerated pattern of gait, listening for R foot scuffing as a cue to increase foot clearance and step clearance        Neuro Re-education     [facility]) - 09/29/16 [id number]    PWR! exercises Moves in sitting;Moves in standing   PWR! Up x 10   PWR! Rock x 10 reps each side   PWR! Twist x 10 reps each side   PWR Step x 10 reps each side   Comments For standing PWR! Moves, verbal and visual cues for technique and for intensity   PWR! Up x 10   PWR! Rock x 20   PWR! Twist x 20   PWR! Step x 20   Comments Review of PWR! Up and PWR! Rock, with verbal and visual cues provided.  Continued cues need for Twist and step in sitting position  PT Education - 09/29/16 1411    Education provided Yes   Education Details Added PWR! Twist and PWR! Step in sitting to HEP-how to find PWR! Moves on Youtube as reference for correct performance of HEP exercises   Person(s) Educated Patient;Spouse   Methods Explanation;Demonstration;Handout   Comprehension Verbalized understanding;Returned demonstration;Verbal cues required      Self Care: -Discussed with patient and wife options for occupational therapy, speech therapy, based on pt's comments at PT eval and today (difficulty with putting on jacket, lower voice volume/decreased voice quality per wife).  Discussed benefits of OT and speech therapy early on in course of Parkinson's disease.  Pt/wife verbalize understanding, but do  not commit/agree to requesting orders for OT/speech at this time.  -discussed postural and gait awareness, including larger amplitude/large intensity movement patterns (feels like RLE is exaggerated with movements with gait), in order to achieve normal movement patterns  -Patient requests copy of information on avoiding taking Sinemet with meals.  Provided patient with info from Doreene Adas Every Victory Counts booklet.  Suggested that patient defer further medication questions to his physician.      PT Short Term Goals - 09/25/16 1408      PT SHORT TERM GOAL #1   Title Pt will perform HEP with wife's supervision for improved transfers, balance and gait.  TARGET 10/24/15   Time 4   Period Weeks   Status New     PT SHORT TERM GOAL #2   Title Pt will improve 5x sit<>stand to less than or equal to 20 seconds for decreased fall risk.   Time 4   Period Weeks   Status New     PT SHORT TERM GOAL #3   Title Pt will improve TUG score to less than or equal to 13.5 seconds for decreased fall risk.   Time 4   Period Weeks   Status New     PT SHORT TERM GOAL #4   Title MiniBESTest score to improve to at least 22/28 for decreased fall risk.   Time 4   Period Weeks   Status New     PT SHORT TERM GOAL #5   Title Pt/wife will verbalize understanding of local Parkinson's disease resources.   Time 4   Period Weeks   Status New           PT Long Term Goals - 09/24/16 2232      PT LONG TERM GOAL #1   Title Pt/wife will verbalize understanding of fall prevention in home environment.  TARGET 11/23/16   Time 8   Period Weeks   Status New     PT LONG TERM GOAL #2   Title Pt will improve 5x sit<>stand test to less than or equal to 15 seconds for improved efficiency/safety with transfers.   Time 8   Period Weeks   Status New     PT LONG TERM GOAL #3   Title Pt will improve gait velocity to at least 2.62 ft/sec for improved gait efficiency and safety.   Time 8   Period Weeks    Status New     PT LONG TERM GOAL #4   Title Pt will ambulate at least 1000 ft, indoor and outdoor surfaces, modified independently, no LOB, for improved community gait.     Time 8   Period Weeks   Status New     PT LONG TERM GOAL #5   Title Pt will verbalize plans for continued community fitness upon D/C  from PT.   Time 8   Period Weeks   Status New               Plan - 09/29/16 1414    Clinical Impression Statement Focused PT session today on review of HEP from last visit, as well as additional PWR! Moves in sitting and standing.  Pt continues to need cues for increased intensity, larger amplitude movement pattern with exercises and with gait.  Pt will continue to benefit from skilled PT to address posture, balance and gait.   Rehab Potential Good   PT Frequency 2x / week   PT Duration 8 weeks  plus eval   PT Treatment/Interventions ADLs/Self Care Home Management;Functional mobility training;Gait training;DME Instruction;Patient/family education;Neuromuscular re-education;Balance training;Therapeutic exercise;Therapeutic activities   PT Next Visit Plan Review PWR! Up and PWR! Rock in sitting, then add PWR! twist and step in sitting; review transfer training; Add PWR! Moves in standing to HEP; work on gait with intensity  Ask about OT, speech evals-   PT Home Exercise Plan 09/25/16:  Added PWR! Up and Rock in sitting; sit<>stand transfers, 09/29/16-PWR! Twist and PWR! step added in sitting   Consulted and Agree with Plan of Care Patient;Family member/caregiver      Patient will benefit from skilled therapeutic intervention in order to improve the following deficits and impairments:  Abnormal gait, Decreased balance, Decreased mobility, Decreased strength, Difficulty walking, Postural dysfunction  Visit Diagnosis: Unsteadiness on feet  Abnormal posture  Other abnormalities of gait and mobility     Problem List There are no active problems to display for this  patient.   Frazier Butt 09/29/2016, 2:18 PM Frazier Butt., PT [place] [address] [place], [place], [address] Phone: [phone]   Fax:  [phone]  Name: KARLAN MASTALSKI MRN: [id number] Date of Birth: 05-Aug-1941

## 2016-09-29 NOTE — Patient Instructions (Signed)
PWR! Moves You Tube  -look at [facility]! Moves Sitting  -this should give you a good idea, good reminder of how to perform the moves in sitting

## 2016-10-01 ENCOUNTER — Ambulatory Visit: Payer: Medicare Other | Admitting: Physical Therapy

## 2016-10-01 DIAGNOSIS — R2681 Unsteadiness on feet: Secondary | ICD-10-CM

## 2016-10-01 DIAGNOSIS — R2689 Other abnormalities of gait and mobility: Secondary | ICD-10-CM | POA: Diagnosis not present

## 2016-10-01 DIAGNOSIS — R293 Abnormal posture: Secondary | ICD-10-CM

## 2016-10-01 NOTE — Therapy (Signed)
[facility] [address] [place], [place], [address] Phone: [phone]   Fax:  [phone]  Physical Therapy Treatment  Patient Details  Name: Troy Cox MRN: [id number] Date of Birth: 03-29-1941 Referring Provider: Jeannie Fend [facility])  Encounter Date: 10/01/2016      PT End of Session - 10/01/16 1412    Visit Number 4   Number of Visits 16   Date for PT Re-Evaluation 11/23/16   Authorization Type Medicare primary; [employer] 2nd; (GCODE every 10th visit)   PT Start Time 0802   PT Stop Time 0844   PT Time Calculation (min) 42 min   Equipment Utilized During Treatment Gait belt   Activity Tolerance Patient tolerated treatment well   Behavior During Therapy [facility] for tasks assessed/performed      No past medical history on file.  No past surgical history on file.  There were no vitals filed for this visit.      Subjective Assessment - 10/01/16 0807    Subjective Ran out of the Sinemet and had difficulty refilling it.  Daughter called and spoke to physician; they told me to hold off completely of the Sinemet and then they started me on just one pill for next two weeks.  Having the tremors all over again, and I feel like I'm starting all over again.   Patient is accompained by: Family member  wife, Troy Cox   Pertinent History history of back pain   Patient Stated Goals Pt's goal for therapy is to improve walking.   Currently in Pain? No/denies                         [facility] Adult PT Treatment/Exercise - 10/01/16 0838      Transfers   Transfers Sit to Stand;Stand to Sit   Sit to Stand 5: Supervision;Without upper extremity assist;From chair/3-in-1;From elevated surface   Stand to Sit 5: Supervision;Without upper extremity assist;To elevated surface;To chair/3-in-1   Number of Reps 10 reps;Other sets (comment)  from 22", 18", then 16" surfaces   Transfer Cueing Cues for upright posture  upon standing   Comments Occasional cues for increased momentum, forward lean     Self-Care   Self-Care Other Self-Care Comments   Other Self-Care Comments  Discussed use of journal to document symptoms-in particular episodes of jerky/tremor type movements and episodes when patient feels he is more forgetful (pt feels this is different with recent medication changes);  discussed importance of journaling to follow up with neurologist.  Recommended pt use cane at this time, as symptoms are fluctuating with medication changes per neurologist recommendation, for safety with gait.           PWR [facility]) - 10/01/16 [id number]    PWR! exercises Moves in sitting   PWR! Up x 10   PWR! Rock x 10    PWR! Twist x 10    PWR! Step x 10   Comments Review of PWR! Moves in sitting.  Pt stops occasionally during HEP, reporting he is unsure of what he is doing.  Overall, pt's movement pattern with exercises looks good, needing occasional cues for intensity     Discussed with pt and wife how each movement above connects to functional activity/purpose of each movement:  PWR! Up for posture, PWR! Rock for [employer], [place]! Twist for trunk rotation and PWR! Step for initiation of stepping.  Pt requires cues throughout PWR! Moves for optimal performance.  PT Education - 10/01/16 1411    Education provided Yes   Education Details Use of journal to track symptoms, with changes of medication   Person(s) Educated Patient;Spouse   Methods Explanation   Comprehension Verbalized understanding          PT Short Term Goals - 09/25/16 1408      PT SHORT TERM GOAL #1   Title Pt will perform HEP with wife's supervision for improved transfers, balance and gait.  TARGET 10/24/15   Time 4   Period Weeks   Status New     PT SHORT TERM GOAL #2   Title Pt will improve 5x sit<>stand to less than or equal to 20 seconds for decreased fall risk.   Time 4   Period Weeks   Status New     PT SHORT TERM GOAL #3    Title Pt will improve TUG score to less than or equal to 13.5 seconds for decreased fall risk.   Time 4   Period Weeks   Status New     PT SHORT TERM GOAL #4   Title MiniBESTest score to improve to at least 22/28 for decreased fall risk.   Time 4   Period Weeks   Status New     PT SHORT TERM GOAL #5   Title Pt/wife will verbalize understanding of local Parkinson's disease resources.   Time 4   Period Weeks   Status New           PT Long Term Goals - 09/24/16 2232      PT LONG TERM GOAL #1   Title Pt/wife will verbalize understanding of fall prevention in home environment.  TARGET 11/23/16   Time 8   Period Weeks   Status New     PT LONG TERM GOAL #2   Title Pt will improve 5x sit<>stand test to less than or equal to 15 seconds for improved efficiency/safety with transfers.   Time 8   Period Weeks   Status New     PT LONG TERM GOAL #3   Title Pt will improve gait velocity to at least 2.62 ft/sec for improved gait efficiency and safety.   Time 8   Period Weeks   Status New     PT LONG TERM GOAL #4   Title Pt will ambulate at least 1000 ft, indoor and outdoor surfaces, modified independently, no LOB, for improved community gait.     Time 8   Period Weeks   Status New     PT LONG TERM GOAL #5   Title Pt will verbalize plans for continued community fitness upon D/C from PT.   Time 8   Period Weeks   Status New               Plan - 10/01/16 1412    Clinical Impression Statement Pt appears visibly frustrated during PT visit today due to changes in Sinemet medication dosage, with noted increased whole body type tremor/jerking, multiple times throughout PT session today.  Pt appears more easily distracted during exercises this visit.  Pt's transfers and gait appears overall more fluid with cues for upright posture, increased step length and arm swing.  However, pt has multiple episodes during PT session seated and standing, where he experiences whole body type  jerk or tremor.  Discussed importance of documenting symptoms and following up with neurologist with changes.   Rehab Potential Good   PT Frequency 2x / week   PT Duration  8 weeks  plus eval   PT Treatment/Interventions ADLs/Self Care Home Management;Functional mobility training;Gait training;DME Instruction;Patient/family education;Neuromuscular re-education;Balance training;Therapeutic exercise;Therapeutic activities   PT Next Visit Plan Work on [employer]! Moves in standing-posture, balance, and gait training activities, as pt tolerates   PT Home Exercise Plan 09/25/16:  Added PWR! Up and Rock in sitting; sit<>stand transfers, 09/29/16-PWR! Twist and PWR! step added in sitting   Consulted and Agree with Plan of Care Patient;Family member/caregiver      Patient will benefit from skilled therapeutic intervention in order to improve the following deficits and impairments:  Abnormal gait, Decreased balance, Decreased mobility, Decreased strength, Difficulty walking, Postural dysfunction  Visit Diagnosis: Abnormal posture  Unsteadiness on feet     Problem List There are no active problems to display for this patient.   Felina Tello W. 10/01/2016, 7:11 PM  Frazier Butt., PT  [place] [address] [place], [place], [address] Phone: [phone]   Fax:  [phone]  Name: SUJAL ALDAMA MRN: [id number] Date of Birth: 1941-02-04

## 2016-10-07 ENCOUNTER — Encounter: Payer: Self-pay | Admitting: Physical Therapy

## 2016-10-07 ENCOUNTER — Ambulatory Visit: Payer: Medicare Other | Admitting: Physical Therapy

## 2016-10-07 DIAGNOSIS — R293 Abnormal posture: Secondary | ICD-10-CM | POA: Diagnosis not present

## 2016-10-07 DIAGNOSIS — R2689 Other abnormalities of gait and mobility: Secondary | ICD-10-CM | POA: Diagnosis not present

## 2016-10-07 DIAGNOSIS — R2681 Unsteadiness on feet: Secondary | ICD-10-CM | POA: Diagnosis not present

## 2016-10-07 NOTE — Therapy (Signed)
[facility] [address] [place], [place], [address] Phone: [phone]   Fax:  [phone]  Physical Therapy Treatment  Patient Details  Name: Troy Cox MRN: [id number] Date of Birth: 11-Nov-1940 Referring Provider: Jeannie Fend [facility])  Encounter Date: 10/07/2016      PT End of Session - 10/07/16 1551    Visit Number 5   Number of Visits 16   Date for PT Re-Evaluation 11/23/16   Authorization Type Medicare primary; [employer] 2nd; (GCODE every 10th visit)   PT Start Time 0846   PT Stop Time 0931   PT Time Calculation (min) 45 min   Activity Tolerance Patient tolerated treatment well   Behavior During Therapy [facility] for tasks assessed/performed      History reviewed. No pertinent past medical history.  History reviewed. No pertinent surgical history.  There were no vitals filed for this visit.      Subjective Assessment - 10/07/16 1537    Subjective Feeling good. Reports he thinks recent tremor and speech problems related to stress (wife also having medical workup). MD agreed and reports he is back taking lower dose medicine with goal to increase dosage. Feeling much better since medicine re-started. Reports he feels his Rt leg is shorter than left and contributes to his "drop off" to the right and inability to match rt step length to lt.    Patient is accompained by: Family member  wife, [name]   Pertinent History history of back pain   Patient Stated Goals Pt's goal for therapy is to improve walking.   Currently in Pain? No/denies                         [facility] Adult PT Treatment/Exercise - 10/07/16 0001      Bed Mobility   Bed Mobility Supine to Sit;Sit to Supine   Supine to Sit 6: Modified independent (Device/Increase time)   Sit to Supine 6: Modified independent (Device/Increase time)     Transfers   Transfers Sit to Stand;Stand to Sit   Sit to Stand 4: Min assist   Sit to  Stand Details (indicate cue type and reason) x 5 throughout session; significant LOB to his left and posterior when rising and starting walking towards his left x1. Pt regained with lateral step and assist    Stand to Sit 5: Supervision;Without upper extremity assist;To elevated surface;To chair/3-in-1     Ambulation/Gait   Ambulation/Gait Assistance 5: Supervision   Ambulation/Gait Assistance Details with and without 1/2" heel lift in rt shoe; Rt shoulder less depressed compared to right, slight Rt lateral flexion in rt stance remains (lesser with lift)Cues for increased step length, increased arm swing, improved posture   Ambulation Distance (Feet) 330 Feet  220, 110   Assistive device None   Gait Pattern Step-through pattern;Decreased arm swing - right;Decreased step length - right;Lateral trunk lean to right;Poor foot clearance - right   Ambulation Surface Level;Indoor   Gait Comments Rt scuffing initially worse with 1/2" lift, however pt quickly accomodated and did not continue. Wife reported drastic improvement in gait with heel lift     Posture/Postural Control   Posture/Postural Control Postural limitations   Postural Limitations Rounded Shoulders;Forward head   Posture Comments supine stretch for leg length measure; encourage pt to only use one or no pillow to allow for extension to neutral posture (maintain ROM)  PT Education - 10/07/16 1549    Education provided Yes   Education Details leg length discrepany ~5/8 inch (rt shorter); how to obtain temporary heel lift; how to obtain orthotist consult for custom lift vs shoe modifications   Person(s) Educated Patient;Spouse   Methods Explanation;Demonstration;Handout   Comprehension Verbalized understanding          PT Short Term Goals - 09/25/16 1408      PT SHORT TERM GOAL #1   Title Pt will perform HEP with wife's supervision for improved transfers, balance and gait.  TARGET 10/24/15   Time 4   Period  Weeks   Status New     PT SHORT TERM GOAL #2   Title Pt will improve 5x sit<>stand to less than or equal to 20 seconds for decreased fall risk.   Time 4   Period Weeks   Status New     PT SHORT TERM GOAL #3   Title Pt will improve TUG score to less than or equal to 13.5 seconds for decreased fall risk.   Time 4   Period Weeks   Status New     PT SHORT TERM GOAL #4   Title MiniBESTest score to improve to at least 22/28 for decreased fall risk.   Time 4   Period Weeks   Status New     PT SHORT TERM GOAL #5   Title Pt/wife will verbalize understanding of local Parkinson's disease resources.   Time 4   Period Weeks   Status New           PT Long Term Goals - 09/24/16 2232      PT LONG TERM GOAL #1   Title Pt/wife will verbalize understanding of fall prevention in home environment.  TARGET 11/23/16   Time 8   Period Weeks   Status New     PT LONG TERM GOAL #2   Title Pt will improve 5x sit<>stand test to less than or equal to 15 seconds for improved efficiency/safety with transfers.   Time 8   Period Weeks   Status New     PT LONG TERM GOAL #3   Title Pt will improve gait velocity to at least 2.62 ft/sec for improved gait efficiency and safety.   Time 8   Period Weeks   Status New     PT LONG TERM GOAL #4   Title Pt will ambulate at least 1000 ft, indoor and outdoor surfaces, modified independently, no LOB, for improved community gait.     Time 8   Period Weeks   Status New     PT LONG TERM GOAL #5   Title Pt will verbalize plans for continued community fitness upon D/C from PT.   Time 8   Period Weeks   Status New               Plan - 10/07/16 1552    Clinical Impression Statement Bright affect during session. Skilled session assessing leg length discrepancy and trial of 1/2" heel lift in rt shoe with noted improvement in gait (including verbal cues for posture, heelstrike/foot clearance, arm swing). Educated in process to get MD referral for  Orthotist consult as likely needs shoe modification for correction.    Rehab Potential Good   PT Frequency 2x / week   PT Duration 8 weeks  plus eval   PT Treatment/Interventions ADLs/Self Care Home Management;Functional mobility training;Gait training;DME Instruction;Patient/family education;Neuromuscular re-education;Balance training;Therapeutic exercise;Therapeutic activities   PT Next Visit  Plan check on progress with Rt heel lift/orthotist consult; Work on [employer]! Moves in standing-posture, balance, and gait training activities, as pt tolerates   PT Home Exercise Plan 09/25/16:  Added PWR! Up and Rock in sitting; sit<>stand transfers, 09/29/16-PWR! Twist and PWR! step added in sitting   Consulted and Agree with Plan of Care Patient;Family member/caregiver   Family Member Consulted wife      Patient will benefit from skilled therapeutic intervention in order to improve the following deficits and impairments:  Abnormal gait, Decreased balance, Decreased mobility, Decreased strength, Difficulty walking, Postural dysfunction  Visit Diagnosis: Abnormal posture  Other abnormalities of gait and mobility     Problem List There are no active problems to display for this patient.   Rexanne Mano, PT 10/07/2016, 3:57 PM  [place] [address] [place], [place], [address] Phone: [phone]   Fax:  [phone]  Name: Troy Cox MRN: [id number] Date of Birth: Apr 29, 1941

## 2016-10-09 ENCOUNTER — Ambulatory Visit: Payer: Medicare Other | Attending: Pharmacist | Admitting: Physical Therapy

## 2016-10-09 DIAGNOSIS — R2681 Unsteadiness on feet: Secondary | ICD-10-CM | POA: Insufficient documentation

## 2016-10-09 DIAGNOSIS — R2689 Other abnormalities of gait and mobility: Secondary | ICD-10-CM | POA: Insufficient documentation

## 2016-10-09 DIAGNOSIS — R293 Abnormal posture: Secondary | ICD-10-CM

## 2016-10-09 NOTE — Therapy (Signed)
[facility] [address] [place], [place], [address] Phone: [phone]   Fax:  [phone]  Physical Therapy Treatment  Patient Details  Name: Troy Cox MRN: [id number] Date of Birth: [birth year] Referring Provider: Jeannie Cox [facility])  Encounter Date: 10/09/2016      PT End of Session - 10/09/16 1347    Visit Number 6   Number of Visits 16   Date for PT Re-Evaluation 11/23/16   Authorization Type Medicare primary; [employer] 2nd; (GCODE every 10th visit)   PT Start Time 0850   PT Stop Time 0930   PT Time Calculation (min) 40 min   Equipment Utilized During Treatment Gait belt   Activity Tolerance Patient tolerated treatment well   Behavior During Therapy [facility] for tasks assessed/performed      No past medical history on file.  No past surgical history on file.  There were no vitals filed for this visit.      Subjective Assessment - 10/09/16 0853    Subjective Went and got an adjustable heel lift and am wearing it.  Feel so much better.  No stumbles or falls.   Patient is accompained by: Family member  wife, Troy Cox; daughter Troy Cox   Pertinent History history of back pain   Patient Stated Goals Pt's goal for therapy is to improve walking.   Currently in Pain? No/denies                         [facility] Adult PT Treatment/Exercise - 10/09/16 0001      Transfers   Transfers Sit to Stand;Stand to Sit   Sit to Stand 5: Supervision;Without upper extremity assist;From bed;From chair/3-in-1   Stand to Sit 5: Supervision;Without upper extremity assist;To bed;To chair/3-in-1   Number of Reps 10 reps;Other sets (comment)  from 22", 18", 16" surfaces   Transfer Cueing Cus for increased forward lean, for increased intensity/momentum, and upright posture upon standing     Ambulation/Gait   Ambulation/Gait Yes   Ambulation/Gait Assistance 5: Supervision   Ambulation/Gait Assistance Details  Wearing shoes with R heel lift (pt purchased after previous PT session)   Ambulation Distance (Feet) 800 Feet   Assistive device None   Gait Pattern Step-through pattern;Decreased arm swing - right;Decreased step length - right;Lateral trunk lean to right;Poor foot clearance - right  Gait pattern improved with cues    Ambulation Surface Level;Indoor   Pre-Gait Activities Forward/back walking in hallway, x 4 reps with good transition weightshift from forward<>backward direction     High Level Balance   High Level Balance Activities Other (comment)   High Level Balance Comments At counter:  forward step and weigthshift x 10 reps, then back step and weightshift x 10 reps, then combined forward<>back step and weigthshift x 10 reps with cues for step length, foot clearance, and upright posture.  For RLE, pt requires additional cues for increased intensity of movement for optimal foot clearance and step length           PWR [facility]) - 10/09/16 [id number]    PWR! exercises Moves in standing   PWR! Up x 20   PWR! Rock x 20   PWR! Twist x 20   PWR Step x 20   Comments Standing PWR! Moves performed at [employer], verbal and occasional tactile cues provided for technique and for intensity.  Explained the relevance of each PWR! Move to functional activities.  PT Education - 10/09/16 1347    Education provided Yes   Education Details Provided handout for standing PWR! Moves for [employer]) Educated Patient;Spouse;Child(ren)  Daughter-Troy Cox   Methods Explanation;Demonstration;Verbal cues;Handout;Tactile cues   Comprehension Verbalized understanding;Returned demonstration;Need further instruction          PT Short Term Goals - 09/25/16 1408      PT SHORT TERM GOAL #1   Title Pt will perform HEP with wife's supervision for improved transfers, balance and gait.  TARGET 10/24/15   Time 4   Period Weeks   Status New     PT SHORT TERM GOAL #2   Title Pt will improve 5x  sit<>stand to less than or equal to 20 seconds for decreased fall risk.   Time 4   Period Weeks   Status New     PT SHORT TERM GOAL #3   Title Pt will improve TUG score to less than or equal to 13.5 seconds for decreased fall risk.   Time 4   Period Weeks   Status New     PT SHORT TERM GOAL #4   Title MiniBESTest score to improve to at least 22/28 for decreased fall risk.   Time 4   Period Weeks   Status New     PT SHORT TERM GOAL #5   Title Pt/wife will verbalize understanding of local Parkinson's disease resources.   Time 4   Period Weeks   Status New           PT Long Term Goals - 09/24/16 2232      PT LONG TERM GOAL #1   Title Pt/wife will verbalize understanding of fall prevention in home environment.  TARGET 11/23/16   Time 8   Period Weeks   Status New     PT LONG TERM GOAL #2   Title Pt will improve 5x sit<>stand test to less than or equal to 15 seconds for improved efficiency/safety with transfers.   Time 8   Period Weeks   Status New     PT LONG TERM GOAL #3   Title Pt will improve gait velocity to at least 2.62 ft/sec for improved gait efficiency and safety.   Time 8   Period Weeks   Status New     PT LONG TERM GOAL #4   Title Pt will ambulate at least 1000 ft, indoor and outdoor surfaces, modified independently, no LOB, for improved community gait.     Time 8   Period Weeks   Status New     PT LONG TERM GOAL #5   Title Pt will verbalize plans for continued community fitness upon D/C from PT.   Time 8   Period Weeks   Status New               Plan - 10/09/16 1348    Clinical Impression Statement Pt appears to sequence PWR! Moves in standing at counter better than previous trial, with those exercises added to HEP to address posture, weightshifting, trunk flexiblity and transition stepping in standing.  Pt able to improve gait pattern with minimal cues for step length, foot clearance and arm swing.  Pt is pleased with addition of heel  lift, and therapist notes no episodes of foot catching on RLE or R lateral trunk lean during session.   Rehab Potential Good   PT Frequency 2x / week   PT Duration 8 weeks  plus eval   PT Treatment/Interventions ADLs/Self Care Home  Management;Functional mobility training;Gait training;DME Instruction;Patient/family education;Neuromuscular re-education;Balance training;Therapeutic exercise;Therapeutic activities   PT Next Visit Plan Review standing PWR! Moves, gait activities, stepping, weigthshifting, compliant surface activities   PT Home Exercise Plan Current HEP:  PWR! Moves in sitting, PWR! Moves in standing   Consulted and Agree with Plan of Care Patient;Family member/caregiver   Family Member Consulted wife, daughter      Patient will benefit from skilled therapeutic intervention in order to improve the following deficits and impairments:  Abnormal gait, Decreased balance, Decreased mobility, Decreased strength, Difficulty walking, Postural dysfunction  Visit Diagnosis: Abnormal posture  Unsteadiness on feet  Other abnormalities of gait and mobility     Problem List There are no active problems to display for this patient.   Iantha Titsworth W. 10/09/2016, 1:53 PM  Frazier Butt., PT  [place] [address] [place], [place], [address] Phone: [phone]   Fax:  [phone]  Name: Troy Cox MRN: [id number] Date of Birth: [birth year]

## 2016-10-14 ENCOUNTER — Ambulatory Visit: Payer: Medicare Other | Admitting: Physical Therapy

## 2016-10-14 DIAGNOSIS — R2681 Unsteadiness on feet: Secondary | ICD-10-CM

## 2016-10-14 DIAGNOSIS — R2689 Other abnormalities of gait and mobility: Secondary | ICD-10-CM | POA: Diagnosis not present

## 2016-10-14 DIAGNOSIS — R293 Abnormal posture: Secondary | ICD-10-CM | POA: Diagnosis not present

## 2016-10-14 NOTE — Therapy (Signed)
[place] [address] [place], [place], [address] Phone: [phone]   Fax:  [phone]  Physical Therapy Treatment  Patient Details  Name: Troy Cox MRN: [id number] Date of Birth: 24-Dec-1940 Referring Provider: Jeannie Fend [facility])  Encounter Date: 10/14/2016      PT End of Session - 10/14/16 1052    Visit Number 7   Number of Visits 16   Date for PT Re-Evaluation 11/23/16   Authorization Type Medicare primary; [employer] 2nd; (GCODE every 10th visit)   PT Start Time 0847   PT Stop Time 0928   PT Time Calculation (min) 41 min   Equipment Utilized During Treatment Gait belt   Activity Tolerance Patient tolerated treatment well   Behavior During Therapy [facility] for tasks assessed/performed      No past medical history on file.  No past surgical history on file.  There were no vitals filed for this visit.      Subjective Assessment - 10/14/16 0849    Subjective No pain, no stumbles or falls since last visit.   Patient is accompained by: Family member  wife, [name]; daughter Amauri Medellin   Pertinent History history of back pain   Patient Stated Goals Pt's goal for therapy is to improve walking.   Currently in Pain? No/denies                         [facility] Adult PT Treatment/Exercise - 10/14/16 0902      Transfers   Transfers Sit to Stand;Stand to Sit   Sit to Stand 5: Supervision;Without upper extremity assist;From bed   Stand to Sit 5: Supervision;Without upper extremity assist;To bed   Number of Reps 10 reps;Other sets (comment)  2nd set standing on compliant surface   Comments Pt verbalizes understanding of need for increased forward lean for improved initiation/completion of transfer when on sofa at home.     Ambulation/Gait   Ambulation/Gait Yes   Ambulation/Gait Assistance 6: Modified independent (Device/Increase time)   Ambulation/Gait Assistance Details Cues to maintain upright  posture as in doorframe exercises   Ambulation Distance (Feet) 345 Feet  then 345 ft with walking poles, then 200 ft no device   Assistive device None  then used bilateral walking poles   Gait Pattern Step-through pattern;Decreased arm swing - right;Decreased step length - right;Lateral trunk lean to right;Poor foot clearance - right   Ambulation Surface Level;Indoor   Gait Comments Pt reports having walking poles at home, so attempted use of walking poles in gym area to see how pt could use with current walking program.  Pt tends to get off-sequence with poles frequently, needing cues and assistance for sequencing and verbal cues for increased step length.     Posture/Postural Control   Posture/Postural Control Postural limitations   Postural Limitations Rounded Shoulders;Forward head   Posture Comments At doorframe-neck retraction x 10 reps with pillow behind head for additional cueing, then scapular retraction x 10 reps at doorframe.     High Level Balance   High Level Balance Comments In parallel bars on [employer]ire[employer] mat:  marching in place x 10, alternating kicks x 10, then alternating forward step taps x 10, then forward/back taps to floor x 10 reps with minimal UE support; standing in place with head turns x 5 and head nods x 5 reps.  Alternating step taps to 6" and 12" steps with minimal to no UE support with cues for large  intensity for improved foot clearance.  Forward step ups x 10 reps each leg with UE support.           [facility]) - 10/14/16 5027    PWR! exercises Moves in standing   PWR! Up x 20   PWR! Rock x 20   PWR! Twist x 20   PWR Step x 20  Forward step x 10 each side, back step x 10 each side   Comments Review of PWR! Moves in standing:  pt return demo understanding with visual and occasional verbal cues for technique and intensity               PT Short Term Goals - 09/25/16 1408      PT SHORT TERM GOAL #1   Title Pt will perform HEP with wife's supervision  for improved transfers, balance and gait.  TARGET 10/24/15   Time 4   Period Weeks   Status New     PT SHORT TERM GOAL #2   Title Pt will improve 5x sit<>stand to less than or equal to 20 seconds for decreased fall risk.   Time 4   Period Weeks   Status New     PT SHORT TERM GOAL #3   Title Pt will improve TUG score to less than or equal to 13.5 seconds for decreased fall risk.   Time 4   Period Weeks   Status New     PT SHORT TERM GOAL #4   Title MiniBESTest score to improve to at least 22/28 for decreased fall risk.   Time 4   Period Weeks   Status New     PT SHORT TERM GOAL #5   Title Pt/wife will verbalize understanding of local Parkinson's disease resources.   Time 4   Period Weeks   Status New           PT Long Term Goals - 09/24/16 2232      PT LONG TERM GOAL #1   Title Pt/wife will verbalize understanding of fall prevention in home environment.  TARGET 11/23/16   Time 8   Period Weeks   Status New     PT LONG TERM GOAL #2   Title Pt will improve 5x sit<>stand test to less than or equal to 15 seconds for improved efficiency/safety with transfers.   Time 8   Period Weeks   Status New     PT LONG TERM GOAL #3   Title Pt will improve gait velocity to at least 2.62 ft/sec for improved gait efficiency and safety.   Time 8   Period Weeks   Status New     PT LONG TERM GOAL #4   Title Pt will ambulate at least 1000 ft, indoor and outdoor surfaces, modified independently, no LOB, for improved community gait.     Time 8   Period Weeks   Status New     PT LONG TERM GOAL #5   Title Pt will verbalize plans for continued community fitness upon D/C from PT.   Time 8   Period Weeks   Status New               Plan - 10/14/16 1052    Clinical Impression Statement Pt demo improved performance of PWR! MOves in standing and appears to be performing at home consistently.  Overall, pt appears to have more fluid, smooth movement patterns, but does need cueing  for increased amplitude of movement.  Rehab Potential Good   PT Frequency 2x / week   PT Duration 8 weeks  plus eval   PT Treatment/Interventions ADLs/Self Care Home Management;Functional mobility training;Gait training;DME Instruction;Patient/family education;Neuromuscular re-education;Balance training;Therapeutic exercise;Therapeutic activities   PT Next Visit Plan Continue to work on posture (doorframe posture activities), gait with walking poles (outdoors if possible), compliant surface activities   PT Home Exercise Plan Current HEP:  PWR! Moves in sitting, PWR! Moves in standing   Consulted and Agree with Plan of Care Patient;Family member/caregiver   Family Member Consulted wife, daughter      Patient will benefit from skilled therapeutic intervention in order to improve the following deficits and impairments:  Abnormal gait, Decreased balance, Decreased mobility, Decreased strength, Difficulty walking, Postural dysfunction  Visit Diagnosis: Unsteadiness on feet  Abnormal posture  Other abnormalities of gait and mobility     Problem List There are no active problems to display for this patient.   Raheel Kunkle W. 10/14/2016, 10:55 AM  Frazier Butt., PT  [place] [address] [place], [place], [address] Phone: [phone]   Fax:  [phone]  Name: Troy Cox MRN: [id number] Date of Birth: 12/19/1940

## 2016-10-15 NOTE — Therapy (Addendum)
[place] [address] [place], [place], [address] Phone: [phone]   Fax:  [phone]  Physical Therapy Evaluation  Patient Details  Name: Troy Cox MRN: [id number] Date of Birth: 1941-03-17 Referring Provider: Jeannie Fend [facility])  Encounter Date: 09/24/2016              PT End of Session - 09/24/16 2214    Visit Number 1   Number of Visits 16   Date for PT Re-Evaluation 11/23/16   Authorization Type Medicare primary; [employer] 2nd; (GCODE every 10th visit)   PT Start Time 0804   PT Stop Time 0852   PT Time Calculation (min) 48 min   Activity Tolerance Patient tolerated treatment well   Behavior During Therapy [facility] for tasks assessed/performed      No past medical history on file.  No past surgical history on file.  There were no vitals filed for this visit.               Subjective Assessment - 09/24/16 0807    Subjective Pt reports noticing several physical symptoms, including hand trembling (approx 1 year ago); he also notes slowed walking, forward posture, history of back pain.  He has used cane for approx 1 month.  Pt has had one fall (forward, trying to get the newspaper)   Patient is accompained by: Family member  wife, Troy Cox   Pertinent History history of back pain   Patient Stated Goals Pt's goal for therapy is to improve walking.   Currently in Pain? Yes   Pain Score 3    Pain Location Back   Pain Orientation Lower   Pain Descriptors / Indicators --  consistent   Pain Type Chronic pain   Pain Frequency Constant   Aggravating Factors  unsure what aggravates   Pain Relieving Factors Medications alleviate    PT will monitor pain, but will not address as a goal at this time, as pain is chronic in nature.              [facility] PT Assessment - 09/24/16 0819            Assessment   Medical Diagnosis Parkinson's disease   Referring  Provider Jeannie Fend  [facility]   Onset Date/Surgical Date 09/11/16       Precautions   Precautions Fall       Balance Screen   Has the patient fallen in the past 6 months Yes   How many times? 1   Has the patient had a decrease in activity level because of a fear of falling?  No   Is the patient reluctant to leave their home because of a fear of falling?  No       Home Ecologist residence   Living Arrangements Spouse/significant other   Type of [place] to enter   Entrance Stairs-Number of Steps [place] on top floor   [place] - single point       Prior Function   Level of [place] with community mobility without device;Independent with household mobility without device  Needs assist with coats   Leisure Enjoys walking in neighborhood, yardwork       Observation/Other Assessments   Focus on Therapeutic Outcomes (FOTO)  NA       Posture/Postural Control   Posture/Postural Control  Postural limitations   Postural Limitations Rounded Shoulders;Forward head       Tone   Assessment Location Right Lower Extremity       ROM / Strength   AROM / PROM / Strength AROM;Strength       AROM   Overall AROM Comments WFL, with possible hamstring tightness noted with upright standing       Strength   Overall Strength Comments Grossly tested at least 4 to 5/5 bilateral lower extremities-hip flexion, quads, hamstrings, ankle dorsiflexion       Transfers   Transfers Sit to Stand;Stand to Sit   Sit to Stand 5: Supervision;Without upper extremity assist;From chair/3-in-1   Five time sit to stand comments  28.22   Stand to Sit 5: Supervision;Without upper extremity assist;To chair/3-in-1   Comments Upon standing, pt does not fully extend knees, has forward flexed posture       Ambulation/Gait    Ambulation/Gait Yes   Ambulation/Gait Assistance 5: Supervision;4: Min guard   Ambulation/Gait Assistance Details Pt appears to have several episodes of whole body type tremor, with LOB upon standing, needing therapist's assist to regain balance.   Ambulation Distance (Feet) 200 Feet   Assistive device Straight cane  but does not use consistently during eval   Gait Pattern Step-through pattern;Decreased arm swing - right;Decreased step length - right;Shuffle;Trendelenburg;Lateral trunk lean to right;Decreased trunk rotation   Ambulation Surface Level;Indoor   Gait velocity 14.94 sec (2.2 ft/sec)  holds cane, 15.75 sec (2.08 ft/sec)no device       Standardized Balance Assessment   Standardized Balance Assessment Timed Up and Go Test       Timed Up and Go Test   Normal TUG (seconds) 15.97   Manual TUG (seconds) 15.13   Cognitive TUG (seconds) 14.03   TUG Comments Scores >13.5-15 sec indicate increased fall risk.       High Level Balance   High Level Balance Comments Initiated MiniBESTest, but did not complete due to time constraints of eval.  Posterior push and release:  pt takes 2 small steps to recover.  Forward push and release-pt has extended time of lean forward, but does not take recovery step.       RLE Tone   RLE Tone Mild                  [facility] Adult PT Treatment/Exercise - 09/24/16 0819            Self-Care   Self-Care Other Self-Care Comments   Other Self-Care Comments  Based on pt's reports of some motor fluctuations during the course of the day, discovered pt is taking Sinemet with meals.  Provided education on medication management that pt should take Sinemet >30 minutes before or 1-2 hours after meals.  Referred additional questions regarding medication to neurologist..                  PT Education - 09/24/16 2212    Education provided Yes   Education Details Discussed POC, including PWR! Moves for large amplitude  movement training in functional context; medication education regarding Sinemet (avoid taking at meal times)   Person(s) Educated Patient;Spouse   Methods Explanation   Comprehension Verbalized understanding                  PT Short Term Goals - 09/24/16 2225            PT SHORT TERM GOAL #1   Title Pt will perform HEP with  wife's supervision for improved transfers, balance and gait.  TARGET 10/24/15   Time 4   Period Weeks   Status New       PT SHORT TERM GOAL #2   Title Pt will improve 5x sit<>stand to less than or equal to 20 seconds for decreased fall risk.   Time 4   Period Weeks   Status New       PT SHORT TERM GOAL #3   Title Pt will improve TUG score to less than or equal to 13.5 seconds for decreased fall risk.   Time 4   Period Weeks   Status New       PT SHORT TERM GOAL #4   Title MiniBESTest to be performed, with goal to be written as appropriate.   Time 4   Period Weeks   Status New       PT SHORT TERM GOAL #5   Title Pt/wife will verbalize understanding of local Parkinson's disease resources.   Time 4   Period Weeks   Status New                  PT Long Term Goals - 09/24/16 2232            PT LONG TERM GOAL #1   Title Pt/wife will verbalize understanding of fall prevention in home environment.  TARGET 11/23/16   Time 8   Period Weeks   Status New       PT LONG TERM GOAL #2   Title Pt will improve 5x sit<>stand test to less than or equal to 15 seconds for improved efficiency/safety with transfers.   Time 8   Period Weeks   Status New       PT LONG TERM GOAL #3   Title Pt will improve gait velocity to at least 2.62 ft/sec for improved gait efficiency and safety.   Time 8   Period Weeks   Status New       PT LONG TERM GOAL #4   Title Pt will ambulate at least 1000 ft, indoor and outdoor surfaces, modified independently, no LOB, for improved community gait.     Time 8    Period Weeks   Status New       PT LONG TERM GOAL #5   Title Pt will verbalize plans for continued community fitness upon D/C from PT.   Time 8   Period Weeks   Status New                  Plan - 09/24/16 2216    Clinical Impression Statement Pt is a 76 year old male who presents to OPPT with recent diagnosis of Parkinson's disease.  He presents with postural instability, decreased balance, bradykinesia, tremors, decreased functional strength with slowed transfers, decreased timing and coordination of gait.  Pt is at fall risk per TUG scores and demonstrates poor step strategy for balance with push and release tests.  Pt's presentation is evolving, as a newly diagnosed PD patient.  Pt would benefit from skilled physical therapy to address the above stated deficits to improve functional mobility and decrease fall risk.   Rehab Potential Good   PT Frequency 2x / week   PT Duration 8 weeks  plus eval   PT Treatment/Interventions ADLs/Self Care Home Management;Functional mobility training;Gait training;DME Instruction;Patient/family education;Neuromuscular re-education;Balance training;Therapeutic exercise;Therapeutic activities   PT Next Visit Plan Complete MiniBESTest, provide info on Power over Parkinson's; work on sit<>stand transfers, Initiate PWR!  Moves   Recommended Other Services Ask about OT, speech therapy evals   Consulted and Agree with Plan of Care Patient;Family member/caregiver      Patient will benefit from skilled therapeutic intervention in order to improve the following deficits and impairments:  Abnormal gait, Decreased balance, Decreased mobility, Decreased strength, Difficulty walking, Postural dysfunction  Visit Diagnosis: Other abnormalities of gait and mobility  Unsteadiness on feet  Abnormal posture          G-Codes - October 20, 2016 12-02-36    Functional Assessment Tool Used 5x sit<>stand 28.22 sec, gait velocity 2.2 ft/sec  carrying cane, 2.08 ft/sec no device; TUG 15.79 sec, TUG man 15.13, TUG cog 14.03 sec; 1 fall in past 6 months   Functional Limitation Mobility: Walking and moving around   Mobility: Walking and Moving Around Current Status [phone]) At least 40 percent but less than 60 percent impaired, limited or restricted   Mobility: Walking and Moving Around Goal Status [phone]) At least 20 percent but less than 40 percent impaired, limited or restricted       Problem List There are no active problems to display for this patient.   Audryna Wendt W. October 20, 2016, 10:50 PM  Frazier Butt., PT  [place] [address] [place], [place], [address] Phone: [phone]   Fax:  [phone]  Name: Troy Cox MRN: [id number] Date of Birth: Sep 24, 1940

## 2016-10-16 ENCOUNTER — Ambulatory Visit: Payer: Medicare Other | Admitting: Physical Therapy

## 2016-10-16 DIAGNOSIS — R2689 Other abnormalities of gait and mobility: Secondary | ICD-10-CM | POA: Diagnosis not present

## 2016-10-16 DIAGNOSIS — R2681 Unsteadiness on feet: Secondary | ICD-10-CM | POA: Diagnosis not present

## 2016-10-16 DIAGNOSIS — R293 Abnormal posture: Secondary | ICD-10-CM

## 2016-10-16 NOTE — Therapy (Signed)
[facility] [address] [place], [place], [address] Phone: [phone]   Fax:  [phone]  Physical Therapy Treatment  Patient Details  Name: Troy Cox MRN: [id number] Date of Birth: Apr 30, 1941 Referring Provider: Jeannie Fend [facility])  Encounter Date: 10/16/2016      PT End of Session - 10/16/16 1801    Visit Number 8   Number of Visits 16   Date for PT Re-Evaluation 11/23/16   Authorization Type Medicare primary; [employer] 2nd; (GCODE every 10th visit)   PT Start Time 0849   PT Stop Time 0932   PT Time Calculation (min) 43 min   Activity Tolerance Patient tolerated treatment well   Behavior During Therapy [facility] for tasks assessed/performed      No past medical history on file.  No past surgical history on file.  There were no vitals filed for this visit.      Subjective Assessment - 10/16/16 0852    Subjective No pain, no changes since last visit.   Patient is accompained by: Family member  wife, Troy Cox   Pertinent History history of back pain   Patient Stated Goals Pt's goal for therapy is to improve walking.   Currently in Pain? No/denies                         [facility] Adult PT Treatment/Exercise - 10/16/16 0852      Transfers   Transfers Sit to Stand;Stand to Sit   Sit to Stand 6: Modified independent (Device/Increase time);Without upper extremity assist;From chair/3-in-1;From bed   Stand to Sit 6: Modified independent (Device/Increase time);Without upper extremity assist;To bed;To chair/3-in-1   Number of Reps 10 reps;Other sets (comment)  3 sets, from 20", 17", 15" soft surface     Ambulation/Gait   Ambulation/Gait Yes   Ambulation/Gait Assistance 6: Modified independent (Device/Increase time)   Ambulation/Gait Assistance Details Cues to maintain upright posture   Ambulation Distance (Feet) 400 Feet  then 230 ft   Assistive device None   Gait Pattern Step-through  pattern;Decreased arm swing - right;Decreased step length - right;Lateral trunk lean to right;Poor foot clearance - right   Ambulation Surface Level;Indoor   Pre-Gait Activities Forward/back walking in gym area, 15-20 ft, 5 reps.  Forward marching x 20 ft, 6 reps, with attempts at coordinated UE movement patterns, with pt having difficulty sequencing UE/lower body movements.       Posture/Postural Control   Posture/Postural Control Postural limitations   Postural Limitations Rounded Shoulders;Forward head   Posture Comments At doorframe-neck retraction x 10 reps with pillow behind head for additional cueing, then 10 reps no pillow, then scapular retraction x 10 reps at doorframe.     High Level Balance   High Level Balance Comments In parallel bars on Airex mat:  marching in place x 10, alternating kicks x 10, then alternating forward step taps x 10, then forward/back taps to floor x 10 reps with minimal UE support.  Sidestep up and over blue mat surface, x 5 reps each direction with minimal UE support.             PWR [facility]) - 10/16/16 6767    PWR! exercises Moves in standing   PWR! Up x 20   PWR! Rock x 20   PWR! Twist x 20   PWR Step x 20  x 20 forward with coordinated UEs, x 20 back with UE support   Comments Pt needs  additional cues for increased step height and excursion for step and weightshift activity; forward/back stepping and weightshifting x 10 reps each side, with cues for large amplitude movement     Additional repeated practice forward stepping over 4" block, for improved step height, x 10 reps each side.          PT Short Term Goals - 10/16/16 1802      PT SHORT TERM GOAL #1   Title Pt will perform HEP with wife's supervision for improved transfers, balance and gait.  TARGET 10/24/15   Time 4   Period Weeks   Status Achieved     PT SHORT TERM GOAL #2   Title Pt will improve 5x sit<>stand to less than or equal to 20 seconds for decreased fall risk.   Time 4    Period Weeks   Status New     PT SHORT TERM GOAL #3   Title Pt will improve TUG score to less than or equal to 13.5 seconds for decreased fall risk.   Time 4   Period Weeks   Status New     PT SHORT TERM GOAL #4   Title MiniBESTest score to improve to at least 22/28 for decreased fall risk.   Time 4   Period Weeks   Status New     PT SHORT TERM GOAL #5   Title Pt/wife will verbalize understanding of local Parkinson's disease resources.   Time 4   Period Weeks   Status New           PT Long Term Goals - 09/24/16 2232      PT LONG TERM GOAL #1   Title Pt/wife will verbalize understanding of fall prevention in home environment.  TARGET 11/23/16   Time 8   Period Weeks   Status New     PT LONG TERM GOAL #2   Title Pt will improve 5x sit<>stand test to less than or equal to 15 seconds for improved efficiency/safety with transfers.   Time 8   Period Weeks   Status New     PT LONG TERM GOAL #3   Title Pt will improve gait velocity to at least 2.62 ft/sec for improved gait efficiency and safety.   Time 8   Period Weeks   Status New     PT LONG TERM GOAL #4   Title Pt will ambulate at least 1000 ft, indoor and outdoor surfaces, modified independently, no LOB, for improved community gait.     Time 8   Period Weeks   Status New     PT LONG TERM GOAL #5   Title Pt will verbalize plans for continued community fitness upon D/C from PT.   Time 8   Period Weeks   Status New               Plan - 10/16/16 1803    Clinical Impression Statement With novel tasks, especially with coordination of upper and lower body activities, pt has difficulty with sequencing upper and lower extremities.  Gait with walking poles outdoors not assessed due to cold temperatures this morning.  Pt will continue to benefit from skilled physical therpay to further address balance, gait, posture activities.   Rehab Potential Good   PT Frequency 2x / week   PT Duration 8 weeks  plus eval    PT Treatment/Interventions ADLs/Self Care Home Management;Functional mobility training;Gait training;DME Instruction;Patient/family education;Neuromuscular re-education;Balance training;Therapeutic exercise;Therapeutic activities   PT Next Visit Plan Check STGs;  Try to add doorframe postural activities and standing forward step/back step strategies as part of HEP; gait with walking poles outdoors if possible   PT Home Exercise Plan Current HEP:  PWR! Moves in sitting, PWR! Moves in standing   Consulted and Agree with Plan of Care Patient;Family member/caregiver   Family Member Consulted wife      Patient will benefit from skilled therapeutic intervention in order to improve the following deficits and impairments:  Abnormal gait, Decreased balance, Decreased mobility, Decreased strength, Difficulty walking, Postural dysfunction  Visit Diagnosis: Unsteadiness on feet  Abnormal posture  Other abnormalities of gait and mobility     Problem List There are no active problems to display for this patient.   Shaily Librizzi W. 10/16/2016, 6:07 PM [facility] [address] [facility] [address] [place], [place], [address] Phone: [phone]   Fax:  [phone]  Name: Troy Cox MRN: [id number] Date of Birth: 1940-11-02

## 2016-10-21 ENCOUNTER — Ambulatory Visit: Payer: Medicare Other | Admitting: Physical Therapy

## 2016-10-21 DIAGNOSIS — R2689 Other abnormalities of gait and mobility: Secondary | ICD-10-CM | POA: Diagnosis not present

## 2016-10-21 DIAGNOSIS — R293 Abnormal posture: Secondary | ICD-10-CM

## 2016-10-21 DIAGNOSIS — R2681 Unsteadiness on feet: Secondary | ICD-10-CM | POA: Diagnosis not present

## 2016-10-21 NOTE — Therapy (Signed)
[facility] [address] [place], [place], [address] Phone: [phone]   Fax:  [phone]  Physical Therapy Treatment  Patient Details  Name: Troy Cox MRN: [id number] Date of Birth: 28-Jun-1941 Referring Provider: Jeannie Fend [facility])  Encounter Date: 10/21/2016      PT End of Session - 10/21/16 2003    Visit Number 9   Number of Visits 16   Date for PT Re-Evaluation 11/23/16   Authorization Type Medicare primary; [employer] 2nd; (GCODE every 10th visit)   PT Start Time 0848   PT Stop Time 0932   PT Time Calculation (min) 44 min   Activity Tolerance Patient tolerated treatment well   Behavior During Therapy [facility] for tasks assessed/performed      No past medical history on file.  No past surgical history on file.  There were no vitals filed for this visit.      Subjective Assessment - 10/21/16 0850    Subjective No changes since last visit.  No pain now, but had a hard time sleeping due to pain in LLE last night   Patient is accompained by: Family member  wife, [name]; daughter Buster Schueller   Pertinent History history of back pain   Patient Stated Goals Pt's goal for therapy is to improve walking.   Currently in Pain? No/denies            [facility] PT Assessment - 10/21/16 0001      Transfers   Five time sit to stand comments  16.19                     [facility] Adult PT Treatment/Exercise - 10/21/16 0001      Transfers   Transfers Sit to Stand;Stand to Sit   Sit to Stand 6: Modified independent (Device/Increase time);Without upper extremity assist;From chair/3-in-1   Stand to Sit 6: Modified independent (Device/Increase time);Without upper extremity assist;To chair/3-in-1     Ambulation/Gait   Ambulation/Gait Yes   Ambulation/Gait Assistance 6: Modified independent (Device/Increase time)   Ambulation Surface Level;Indoor   Pre-Gait Activities With transitional movements today (from  doorframe to wall after postural activity and with attempts to initiate gait from standing position at mat, pt appears to experience increased lower extremity tremor (?freezing/festinating episode).  Discussed ways to counteract this, including stopping, resetting posture and weightshifting prior to initiating large steps.  Provided patient with tips to reduce freezing/festinating with gait.   Gait Comments Pt wearing tennis shoes today, with insert (reports this is the first day he's worn lift in tennis shoes, versus boots).  Pt noted to have increased episodes of foot scuffing and Trendelenburg gait this visit.     Posture/Postural Control   Posture/Postural Control Postural limitations   Postural Limitations Rounded Shoulders;Forward head   Posture Comments At doorframe-neck retraction x 10 reps with rolled towel for additional cueing, then scapular retraction x 10 reps at doorframe.  Followed by standing "tall as the wall" posture with best upright posture at wall.       Standardized Balance Assessment   Standardized Balance Assessment Timed Up and Go Test     Timed Up and Go Test   TUG Normal TUG   Normal TUG (seconds) 11.44     High Level Balance   High Level Balance Comments MiniBESTest completed this visit:  score 20/28 (improved from 19/28)      Mini-BESTest: Balance Evaluation Systems Test  2005-2013 [place]. All  rights reserved. ________________________________________________________________________________________Anticipatory_________Subscore__5___/6 1. SIT TO STAND Instruction: "Cross your arms across your chest. Try not to use your hands unless you must.Do not let your legs lean against the back of the chair when you stand. Please stand up now." X(2) Normal: Comes to stand without use of hands and stabilizes independently. (1) Moderate: Comes to stand WITH use of hands on first attempt. (0) Severe: Unable to stand up from chair without assistance,  OR needs several attempts with use of hands. 2. RISE TO TOES Instruction: "Place your feet shoulder width apart. Place your hands on your hips. Try to rise as high as you can onto your toes. I will count out loud to 3 seconds. Try to hold this pose for at least 3 seconds. Look straight ahead. Rise now." X(2) Normal: Stable for 3 s with maximum height. (1) Moderate: Heels up, but not full range (smaller than when holding hands), OR noticeable instability for 3 s. (0) Severe: < 3 s. 3. STAND ON ONE LEG Instruction: "Look straight ahead. Keep your hands on your hips. Lift your leg off of the ground behind you without touching or resting your raised leg upon your other standing leg. Stay standing on one leg as long as you can. Look straight ahead. Lift now." Left: Time in Seconds Trial 1:___6.06__Trial 2:__5.09___ (2) Normal: 20 s. X(1) Moderate: < 20 s. (0) Severe: Unable. Right: Time in Seconds Trial 1:__1.84___Trial 2:__6.72___ (2) Normal: 20 s. X(1) Moderate: < 20 s. (0) Severe: Unable To score each side separately use the trial with the longest time. To calculate the sub-score and total score use the side [left or right] with the lowest numerical score [i.e. the worse side]. ______________________________________________________________________________________Reactive Postural Control___________Subscore:___4__/6 4. COMPENSATORY STEPPING CORRECTION- FORWARD Instruction: "Stand with your feet shoulder width apart, arms at your sides. Lean forward against my hands beyond your forward limits. When I let go, do whatever is necessary, including taking a step, to avoid a fall." (2) Normal: Recovers independently with a single, large step (second realignment step is allowed). X(1) Moderate: More than one step used to recover equilibrium. (0) Severe: No step, OR would fall if not caught, OR falls spontaneously. 5. COMPENSATORY STEPPING CORRECTION- BACKWARD Instruction: "Stand with your feet  shoulder width apart, arms at your sides. Lean backward against my hands beyond your backward limits. When I let go, do whatever is necessary, including taking a step, to avoid a fall." (2) Normal: Recovers independently with a single, large step. X(1) Moderate: More than one step used to recover equilibrium. (0) Severe: No step, OR would fall if not caught, OR falls spontaneously. 6. COMPENSATORY STEPPING CORRECTION- LATERAL Instruction: "Stand with your feet together, arms down at your sides. Lean into my hand beyond your sideways limit. When I let go, do whatever is necessary, including taking a step, to avoid a fall." Left X(2) Normal: Recovers independently with 1 step (crossover or lateral OK). (1) Moderate: Several steps to recover equilibrium. (0) Severe: Falls, or cannot step. Right X(2) Normal: Recovers independently with 1 step (crossover or lateral OK). (1) Moderate: Several steps to recover equilibrium. (0) Severe: Falls, or cannot step. Use the side with the lowest score to calculate sub-score and total score. ____________________________________________________________________________________Sensory Orientation_____________Subscore:____6_____/6 7. STANCE (FEET TOGETHER); EYES OPEN, FIRM SURFACE Instruction: "Place your hands on your hips. Place your feet together until almost touching. Look straight ahead. Be as stable and still as possible, until I say stop." Time in seconds:________ X(2) Normal: 30 s. (1) Moderate: < 30  s. (0) Severe: Unable. 8. STANCE (FEET TOGETHER); EYES CLOSED, FOAM SURFACE Instruction: "Step onto the foam. Place your hands on your hips. Place your feet together until almost touching. Be as stable and still as possible, until I say stop. I will start timing when you close your eyes." Time in seconds:________ X(2) Normal: 30 s. (1) Moderate: < 30 s. (0) Severe: Unable. 9. INCLINE- EYES CLOSED Instruction: "Step onto the incline ramp. Please  stand on the incline ramp with your toes toward the top. Place your feet shoulder width apart and have your arms down at your sides. I will start timing when you close your eyes." Time in seconds:________ X(2) Normal: Stands independently 30 s and aligns with gravity. (1) Moderate: Stands independently <30 s OR aligns with surface. (0) Severe: Unable. _________________________________________________________________________________________Dynamic Gait ______Subscore____5____/10 10. CHANGE IN GAIT SPEED Instruction: "Begin walking at your normal speed, when I tell you 'fast', walk as fast as you can. When I say 'slow', walk very slowly." (2) Normal: Significantly changes walking speed without imbalance. X(1) Moderate: Unable to change walking speed or signs of imbalance. (0) Severe: Unable to achieve significant change in walking speed AND signs of imbalance. [place] - HORIZONTAL Instruction: "Begin walking at your normal speed, when I say "right", turn your head and look to the right. When I say "left" turn your head and look to the left. Try to keep yourself walking in a straight line." (2) Normal: performs head turns with no change in gait speed and good balance. X(1) Moderate: performs head turns with reduction in gait speed. (0) Severe: performs head turns with imbalance. 12. WALK WITH PIVOT TURNS Instruction: "Begin walking at your normal speed. When I tell you to 'turn and stop', turn as quickly as you can, face the opposite direction, and stop. After the turn, your feet should be close together." (2) Normal: Turns with feet close FAST (< 3 steps) with good balance. X(1) Moderate: Turns with feet close SLOW (>4 steps) with good balance. (0) Severe: Cannot turn with feet close at any speed without imbalance. 13. STEP OVER OBSTACLES Instruction: "Begin walking at your normal speed. When you get to the box, step over it, not around it and keep walking." (2) Normal: Able  to step over box with minimal change of gait speed and with good balance. X(1) Moderate: Steps over box but touches box OR displays cautious behavior by slowing gait. (0) Severe: Unable to step over box OR steps around box. 14. TIMED UP & GO WITH DUAL TASK [3 METER WALK] Instruction TUG: "When I say 'Go', stand up from chair, walk at your normal speed across the tape on the floor, turn around, and come back to sit in the chair." Instruction TUG with Dual Task: "Count backwards by threes starting at ___. When I say 'Go', stand up from chair, walk at your normal speed across the tape on the floor, turn around, and come back to sit in the chair. Continue counting backwards the entire time." TUG: ____11.44____seconds; Dual Task TUG: ____13.91____seconds (2) Normal: No noticeable change in sitting, standing or walking while backward counting when compared to TUG without Dual Task. X(1) Moderate: Dual Task affects either counting OR walking (>10%) when compared to the TUG without Dual Task. (0) Severe: Stops counting while walking OR stops walking while counting. When scoring item 14, if subject's gait speed slows more than 10% between the TUG without and with a Dual Task the score should be decreased by  a point. TOTAL SCORE: ___20_____/28     Self Care:  Based on pt's c/o pain that affected sleep last night (no pain at treatment session today, but pt visibly fatigued), discussed sleeping positions, including use of pillow under knees in supine and between knees in sidelying.  Pt reports he has tried this before and it has not helped.  He reports this pain is longstanding and has been going on for years.  Discussed results of STG check, POC and plans to continue to address balance, posture, gait.  Asked patient again about OT and speech therapy (as a follow up from eval), but pt declines at this point until he speaks to neurologist at next visit.      PT Education - 10/21/16 2002    Education  provided Yes   Education Details Tips to reduce freezing/festinating with gait; best posture exercise at wall or doorframe.   Person(s) Educated Patient;Spouse   Methods Explanation;Demonstration;Handout   Comprehension Verbalized understanding;Returned demonstration;Verbal cues required          PT Short Term Goals - 10/21/16 0853      PT SHORT TERM GOAL #1   Title Pt will perform HEP with wife's supervision for improved transfers, balance and gait.  TARGET 10/24/15   Time 4   Period Weeks   Status Achieved     PT SHORT TERM GOAL #2   Title Pt will improve 5x sit<>stand to less than or equal to 20 seconds for decreased fall risk.   Baseline 5x sit<>stand 16.19 sec 10/21/16   Time 4   Period Weeks   Status Achieved     PT SHORT TERM GOAL #3   Title Pt will improve TUG score to less than or equal to 13.5 seconds for decreased fall risk.   Baseline 11.44 sec 10/21/16   Time 4   Period Weeks   Status Achieved     PT SHORT TERM GOAL #4   Title MiniBESTest score to improve to at least 22/28 for decreased fall risk.   Baseline 20/28 on MiniBESTest 10/21/16   Time 4   Period Weeks   Status Not Met     PT SHORT TERM GOAL #5   Title Pt/wife will verbalize understanding of local Parkinson's disease resources.   Time 4   Period Weeks   Status Achieved           PT Long Term Goals - 09/24/16 2232      PT LONG TERM GOAL #1   Title Pt/wife will verbalize understanding of fall prevention in home environment.  TARGET 11/23/16   Time 8   Period Weeks   Status New     PT LONG TERM GOAL #2   Title Pt will improve 5x sit<>stand test to less than or equal to 15 seconds for improved efficiency/safety with transfers.   Time 8   Period Weeks   Status New     PT LONG TERM GOAL #3   Title Pt will improve gait velocity to at least 2.62 ft/sec for improved gait efficiency and safety.   Time 8   Period Weeks   Status New     PT LONG TERM GOAL #4   Title Pt will ambulate at least  1000 ft, indoor and outdoor surfaces, modified independently, no LOB, for improved community gait.     Time 8   Period Weeks   Status New     PT LONG TERM GOAL #5   Title Pt will verbalize  plans for continued community fitness upon D/C from PT.   Time 8   Period Weeks   Status New               Plan - 10/21/16 2003    Clinical Impression Statement Pt has met STG 1, 2, 3, 5.  STG 4 not met, with MiniBESTest score 20/28 improved from 19/28.  Pt has improved TUG score and 5x sit<>stand scores.  Pt has demo improved overall functional mobility, but does verbalize frustration at fluctuating functional mobility as well as ongoing fatigue.  Pt will continue to benefit from further skilled PT to address posture, posture and gait for overall improved functional mobility.   Rehab Potential Good   PT Frequency 2x / week   PT Duration 8 weeks  plus eval   PT Treatment/Interventions ADLs/Self Care Home Management;Functional mobility training;Gait training;DME Instruction;Patient/family education;Neuromuscular re-education;Balance training;Therapeutic exercise;Therapeutic activities   PT Next Visit [place] next visit; Review addition of doorframe postural activities to HEP; try to work on standing forward step/back step strategies as part of HEP; gait with walking poles outdoors if possible   PT Home Exercise Plan Current HEP:  PWR! Moves in sitting, PWR! Moves in standing   Consulted and Agree with Plan of Care Patient;Family member/caregiver   Family Member Consulted wife      Patient will benefit from skilled therapeutic intervention in order to improve the following deficits and impairments:  Abnormal gait, Decreased balance, Decreased mobility, Decreased strength, Difficulty walking, Postural dysfunction  Visit Diagnosis: Unsteadiness on feet  Abnormal posture  Other abnormalities of gait and mobility     Problem List There are no active problems to display for this  patient.   Genene Kilman W. 10/21/2016, 8:45 PM  [facility] Butt., PT  [facility] [address] [place], [place], [address] Phone: [phone]   Fax:  [phone]  Name: Troy Cox MRN: [id number] Date of Birth: [birth year]

## 2016-10-21 NOTE — Patient Instructions (Addendum)
Tips to reduce freezing episodes with standing or walking:  1. Stand tall with your feet wide, so that you can rock and weight shift through your hips. 2. Don't try to fight the freeze: if you begin taking slower, faster, smaller steps, STOP, get your posture tall, and RESET your posture and balance.  Take a deep breath before taking the BIG step to start again. 3. March in place, with high knee stepping, to get started walking again. 4. Use auditory cues:  Count out loud, think of a familiar tune or song or cadence, use pocket metronome, to use rhythm to get started walking again. 5. Use visual cues:  Use a line to step over, use laser pointer line to step over, (using BIG steps) to start walking again. 6. Use visual targets to keep your posture tall (look ahead and focus on an object or target at eye level). 7. As you approach where your destination with walking, count your steps out loud and/or focus on your target with your eyes until you are fully there. 8. Use appropriate assistive device, as advised by your physical therapist to assist with taking longer, consistent steps.      Scapular Retraction (Standing)    With arms at sides, pinch shoulder blades together.  Stand at a doorframe or at the wall, and be as tall as the wall for your best posture. Repeat __10__ times per set. Do _1-2___ sessions per day.  http://orth.exer.us/944   Copyright  [employer]. All rights reserved.

## 2016-10-23 ENCOUNTER — Ambulatory Visit: Payer: Medicare Other | Admitting: Physical Therapy

## 2016-10-23 DIAGNOSIS — R2689 Other abnormalities of gait and mobility: Secondary | ICD-10-CM | POA: Diagnosis not present

## 2016-10-23 DIAGNOSIS — R293 Abnormal posture: Secondary | ICD-10-CM

## 2016-10-23 DIAGNOSIS — R2681 Unsteadiness on feet: Secondary | ICD-10-CM | POA: Diagnosis not present

## 2016-10-23 NOTE — Addendum Note (Signed)
Addended by: Frazier Butt on: 10/23/2016 01:04 PM   Modules accepted: Orders

## 2016-10-23 NOTE — Therapy (Signed)
[facility] [address] [place], [place], [address] Phone: [phone]   Fax:  [phone]  Physical Therapy Treatment  Patient Details  Name: Troy Cox MRN: [id number] Date of Birth: February 12, 1941 Referring Provider: Jeannie Fend [facility])  Encounter Date: 10/23/2016      PT End of Session - 10/23/16 1313    Visit Number 10   Number of Visits 16   Date for PT Re-Evaluation 11/23/16   Authorization Type Medicare primary; [employer] 2nd; (GCODE every 10th visit)   PT Start Time 0849   PT Stop Time 0929   PT Time Calculation (min) 40 min   Activity Tolerance Patient tolerated treatment well   Behavior During Therapy [facility] for tasks assessed/performed      No past medical history on file.  No past surgical history on file.  There were no vitals filed for this visit.      Subjective Assessment - 10/23/16 0852    Subjective No changes since last visit.  Still having trouble sleeping. Once I wake up, I just can't go back to sleep.   Patient is accompained by: Family member  wife, Troy Cox; daughter Troy Cox   Pertinent History history of back pain   Patient Stated Goals Pt's goal for therapy is to improve walking.   Currently in Pain? No/denies                         [facility] Adult PT Treatment/Exercise - 10/23/16 0856      Ambulation/Gait   Ambulation/Gait Yes   Ambulation/Gait Assistance 6: Modified independent (Device/Increase time)   Ambulation/Gait Assistance Details Cues for upright posture and increased step length.  Resisted gait x 230 ft with cues for upright posture, increased step length and reciprocal arm swing.   Ambulation Distance (Feet) 400 Feet  then 500 ft outdoors   Assistive device None  bilateral walking poles    Gait Pattern Step-through pattern;Decreased arm swing - right;Decreased step length - right;Lateral trunk lean to right;Poor foot clearance - right   Ambulation  Surface Level;Indoor;Unlevel;Outdoor   Pre-Gait Activities Prior to gait, had brief standing in doorframe for improved posture.   Gait Comments Gait training using bilateral walking poles, with initial instruction of sequencing walking poles.  Continued cues for increased step length and upright posture for optimal sequence of poles.  By end of session, pt has bouts of success with sequence of walking poles.     High Level Balance   High Level Balance Comments In parallel bars:  forward>back step and weigthshift x 15 reps, then standing on foam:  marching in place x 12, forward kicks x 12, then alternating forward step taps x 12, then forward/back step and weightshift with stance leg on foam beam, x 12 reps with bilat UE support.             Balance Exercises - 10/23/16 0907      Balance Exercises: Standing   Standing Eyes Opened Wide (BOA);Foam/compliant surface  Head turns and head nods x 5 reps each   SLS Eyes open;Upper extremity support 2  alternating step taps to 6" and 12" step; 15 reps each   Step Ups Forward;6 inch;UE support 2  15 reps each leg, cues for full foot placement   Other Standing Exercises Forward step tap to 12" step, then back step and weightshift x 15 reps each leg  PT Short Term Goals - 10/21/16 0853      PT SHORT TERM GOAL #1   Title Pt will perform HEP with wife's supervision for improved transfers, balance and gait.  TARGET 10/24/15   Time 4   Period Weeks   Status Achieved     PT SHORT TERM GOAL #2   Title Pt will improve 5x sit<>stand to less than or equal to 20 seconds for decreased fall risk.   Baseline 5x sit<>stand 16.19 sec 10/21/16   Time 4   Period Weeks   Status Achieved     PT SHORT TERM GOAL #3   Title Pt will improve TUG score to less than or equal to 13.5 seconds for decreased fall risk.   Baseline 11.44 sec 10/21/16   Time 4   Period Weeks   Status Achieved     PT SHORT TERM GOAL #4   Title MiniBESTest score to  improve to at least 22/28 for decreased fall risk.   Baseline 20/28 on MiniBESTest 10/21/16   Time 4   Period Weeks   Status Not Met     PT SHORT TERM GOAL #5   Title Pt/wife will verbalize understanding of local Parkinson's disease resources.   Time 4   Period Weeks   Status Achieved           PT Long Term Goals - 09/24/16 2232      PT LONG TERM GOAL #1   Title Pt/wife will verbalize understanding of fall prevention in home environment.  TARGET 11/23/16   Time 8   Period Weeks   Status New     PT LONG TERM GOAL #2   Title Pt will improve 5x sit<>stand test to less than or equal to 15 seconds for improved efficiency/safety with transfers.   Time 8   Period Weeks   Status New     PT LONG TERM GOAL #3   Title Pt will improve gait velocity to at least 2.62 ft/sec for improved gait efficiency and safety.   Time 8   Period Weeks   Status New     PT LONG TERM GOAL #4   Title Pt will ambulate at least 1000 ft, indoor and outdoor surfaces, modified independently, no LOB, for improved community gait.     Time 8   Period Weeks   Status New     PT LONG TERM GOAL #5   Title Pt will verbalize plans for continued community fitness upon D/C from PT.   Time 8   Period Weeks   Status New               Plan - 10/23/16 1313    Clinical Impression Statement Skilled session focused on dynamic balance, compliant surfaces and gait training with bilateral walking poles.  Pt demo improved ability to sequence walking poles after multiple cues for posture, step length.   Rehab Potential Good   PT Frequency 2x / week   PT Duration 8 weeks  plus eval   PT Treatment/Interventions ADLs/Self Care Home Management;Functional mobility training;Gait training;DME Instruction;Patient/family education;Neuromuscular re-education;Balance training;Therapeutic exercise;Therapeutic activities   PT Next Visit Plan Gait training with walking poles outdoors, dynamic balance, compliant surfaces,  posture exercises   PT Home Exercise Plan Current HEP:  PWR! Moves in sitting, PWR! Moves in standing   Consulted and Agree with Plan of Care Patient;Family member/caregiver   Family Member Consulted wife      Patient will benefit from skilled therapeutic intervention in  order to improve the following deficits and impairments:  Abnormal gait, Decreased balance, Decreased mobility, Decreased strength, Difficulty walking, Postural dysfunction  Visit Diagnosis: Abnormal posture  Unsteadiness on feet  Other abnormalities of gait and mobility       G-Codes - Nov 04, 2016 1315    Functional Assessment Tool Used (Outpatient Only) 5x sit<>stand 16.19 sec, TUG 11.44 sec, MiniBEST 20/28   Functional Limitation Mobility: Walking and moving around   Mobility: Walking and Moving Around Current Status [phone]) At least 20 percent but less than 40 percent impaired, limited or restricted   Mobility: Walking and Moving Around Goal Status [phone]) At least 1 percent but less than 20 percent impaired, limited or restricted      Problem List There are no active problems to display for this patient.   Lachrisha Ziebarth W. 11/04/2016, 1:17 PM  Frazier Butt., PT [facility] [address] [place], [place], [address] Phone: [phone]   Fax:  [phone]  Name: NIKKOLAS COOMES MRN: [id number] Date of Birth: 1941-02-08   Physical Therapy Progress Note  Dates of Reporting Period: 09/24/16 to 11-04-16  Objective Reports of Subjective Statement: Improved TUG, improved 5x sit<>stand scores, no falls since eval  Objective Measurements: 5x sit<>stand 16.19 sec, TUG 11.44 sec, MiniBESTest 20/28  Goal Update: Pt has met STG 1, 2,3, and 5 (see above for details).  STG 4 not met for MiniBESTest score (improved from 19/28 to 20/28).  Plan: Continue skilled PT to address posture, balance, gait activities and to reinforce improved awareness of PD-related  deficits, working towards [place].  Reason Skilled Services are Required: Pt remains at fall risk per MiniBESTest scores and continues to demo bradykinesia, postural instability, occasional lower extremity/whole body type tremor, and pt would continue to benefit from skilled PT to improve functional  Mobility and decrease fall risk.  Mady Haagensen, PT Nov 04, 2016 1:21 PM Phone: [phone] Fax: [phone]

## 2016-10-28 DIAGNOSIS — G20 Parkinson's disease: Secondary | ICD-10-CM | POA: Diagnosis not present

## 2016-10-29 ENCOUNTER — Ambulatory Visit: Payer: Medicare Other | Admitting: Physical Therapy

## 2016-10-29 DIAGNOSIS — R2681 Unsteadiness on feet: Secondary | ICD-10-CM | POA: Diagnosis not present

## 2016-10-29 DIAGNOSIS — R293 Abnormal posture: Secondary | ICD-10-CM | POA: Diagnosis not present

## 2016-10-29 DIAGNOSIS — R2689 Other abnormalities of gait and mobility: Secondary | ICD-10-CM | POA: Diagnosis not present

## 2016-10-29 NOTE — Therapy (Signed)
[facility] [address] [place], [place], [address] Phone: [phone]   Fax:  [phone]  Physical Therapy Treatment  Patient Details  Name: Troy Cox MRN: [id number] Date of Birth: 09/10/1940 Referring Provider: Jeannie Fend [facility])  Encounter Date: 10/29/2016      PT End of Session - 10/29/16 2138    Visit Number 11   Number of Visits 16   Date for PT Re-Evaluation 11/23/16   Authorization Type Medicare primary; [employer] 2nd; (GCODE every 10th visit)   PT Start Time 0804   PT Stop Time 0845   PT Time Calculation (min) 41 min   Activity Tolerance Patient tolerated treatment well   Behavior During Therapy [facility] for tasks assessed/performed      No past medical history on file.  No past surgical history on file.  There were no vitals filed for this visit.      Subjective Assessment - 10/29/16 0806    Subjective Went to MD yesterday in [place]-talked a lot about fatigue and lack of sleep.  He added a medication to help with sleeping.  Had a fall on Sunday stepping out of the house to get ready to walk.     Patient is accompained by: Family member  wife, [name]   Pertinent History history of back pain   Patient Stated Goals Pt's goal for therapy is to improve walking.   Currently in Pain? No/denies                         [facility] Adult PT Treatment/Exercise - 10/29/16 0809      Transfers   Transfers Sit to Stand;Stand to Sit   Sit to Stand 6: Modified independent (Device/Increase time);Without upper extremity assist;From chair/3-in-1;From bed   Stand to Sit 6: Modified independent (Device/Increase time);Without upper extremity assist;To chair/3-in-1;To bed   Number of Reps 10 reps;Other sets (comment)  from 22", 18" surfaces then from 22" standing on foam   Comments Requires supervision for sit<>stand on compliant surface due to posterior lean.     High Level Balance   High  Level Balance Comments --     Self-Care   Self-Care Other Self-Care Comments   Other Self-Care Comments  Fall prevention education provided in light of pt's recent fall.  Discussed need to use cane for times where he feels that he is having more tremors/jerkiness in LLE.             Balance Exercises - 10/29/16 0838      Balance Exercises: Standing   Sidestepping Foam/compliant support;3 reps;Upper extremity support  Length of counter  (10 ft ) x 3   Other Standing Exercises Standing on foam:  marching in place x 15 reps, alternating kicks x 15 reps, alternating forward step taps, back step taps, side step taps x 15 reps each with UE support.  On foam:  PWR! Up with gentle squat and upright posture x 10, then lateral weigthshfiting with UE reaches x 10 reps with min guard assistance.    Multiple episodes of lower extremity tremors on comp surface     Postural cues provided throughout standing exercises, as well as cues to stop/reset when lower extremity tremors occur.  Pt noted to have increase LLE tremor with compliant surface activities.        PT Short Term Goals - 10/21/16 0853      PT SHORT TERM GOAL #1   Title Pt  will perform HEP with wife's supervision for improved transfers, balance and gait.  TARGET 10/24/15   Time 4   Period Weeks   Status Achieved     PT SHORT TERM GOAL #2   Title Pt will improve 5x sit<>stand to less than or equal to 20 seconds for decreased fall risk.   Baseline 5x sit<>stand 16.19 sec 10/21/16   Time 4   Period Weeks   Status Achieved     PT SHORT TERM GOAL #3   Title Pt will improve TUG score to less than or equal to 13.5 seconds for decreased fall risk.   Baseline 11.44 sec 10/21/16   Time 4   Period Weeks   Status Achieved     PT SHORT TERM GOAL #4   Title MiniBESTest score to improve to at least 22/28 for decreased fall risk.   Baseline 20/28 on MiniBESTest 10/21/16   Time 4   Period Weeks   Status Not Met     PT SHORT TERM GOAL  #5   Title Pt/wife will verbalize understanding of local Parkinson's disease resources.   Time 4   Period Weeks   Status Achieved           PT Long Term Goals - 09/24/16 2232      PT LONG TERM GOAL #1   Title Pt/wife will verbalize understanding of fall prevention in home environment.  TARGET 11/23/16   Time 8   Period Weeks   Status New     PT LONG TERM GOAL #2   Title Pt will improve 5x sit<>stand test to less than or equal to 15 seconds for improved efficiency/safety with transfers.   Time 8   Period Weeks   Status New     PT LONG TERM GOAL #3   Title Pt will improve gait velocity to at least 2.62 ft/sec for improved gait efficiency and safety.   Time 8   Period Weeks   Status New     PT LONG TERM GOAL #4   Title Pt will ambulate at least 1000 ft, indoor and outdoor surfaces, modified independently, no LOB, for improved community gait.     Time 8   Period Weeks   Status New     PT LONG TERM GOAL #5   Title Pt will verbalize plans for continued community fitness upon D/C from PT.   Time 8   Period Weeks   Status New               Plan - 10/29/16 2139    Clinical Impression Statement Pt demo multiple episodes of lower extremity tremors during standing activities on compliant surfaces this visit.  Had discussion about use of cane when he is having a period of increased tremors.  Will continue to benefit from further skilled PT to address posture, balance, and gait.   Rehab Potential Good   PT Frequency 2x / week   PT Duration 8 weeks  plus eval   PT Treatment/Interventions ADLs/Self Care Home Management;Functional mobility training;Gait training;DME Instruction;Patient/family education;Neuromuscular re-education;Balance training;Therapeutic exercise;Therapeutic activities   PT Next Visit Plan Work on dynamic balance and gait (dual tasking activities), compliant surfaces, posture exercises   PT Home Exercise Plan Current HEP:  PWR! Moves in sitting, PWR!  Moves in standing   Consulted and Agree with Plan of Care Patient;Family member/caregiver   Family Member Consulted wife      Patient will benefit from skilled therapeutic intervention in order to improve the  following deficits and impairments:  Abnormal gait, Decreased balance, Decreased mobility, Decreased strength, Difficulty walking, Postural dysfunction  Visit Diagnosis: Unsteadiness on feet  Abnormal posture     Problem List There are no active problems to display for this patient.   Mariellen Blaney W. 10/29/2016, 9:46 PM Frazier Butt., PT [facility] [address] [place], [place], [address] Phone: [phone]   Fax:  [phone]  Name: LADALE SHERBURN MRN: [id number] Date of Birth: Jun 09, 1941

## 2016-10-30 ENCOUNTER — Ambulatory Visit: Payer: Medicare Other | Admitting: Physical Therapy

## 2016-10-30 DIAGNOSIS — R2681 Unsteadiness on feet: Secondary | ICD-10-CM | POA: Diagnosis not present

## 2016-10-30 DIAGNOSIS — R2689 Other abnormalities of gait and mobility: Secondary | ICD-10-CM | POA: Diagnosis not present

## 2016-10-30 DIAGNOSIS — R293 Abnormal posture: Secondary | ICD-10-CM

## 2016-10-30 NOTE — Therapy (Signed)
[facility] [address] [place], [place], [address] Phone: [phone]   Fax:  [phone]  Physical Therapy Treatment  Patient Details  Name: Troy Cox MRN: [id number] Date of Birth: 10-21-1940 Referring Provider: Jeannie Fend [facility])  Encounter Date: 10/30/2016      PT End of Session - 10/30/16 2019    Visit Number 12   Number of Visits 16   Date for PT Re-Evaluation 11/23/16   Authorization Type Medicare primary; [employer] 2nd; (GCODE every 10th visit)   PT Start Time 0848   PT Stop Time 0929   PT Time Calculation (min) 41 min   Activity Tolerance Patient tolerated treatment well   Behavior During Therapy [facility] for tasks assessed/performed      No past medical history on file.  No past surgical history on file.  There were no vitals filed for this visit.      Subjective Assessment - 10/30/16 0851    Subjective Took the sleeping medication last night and I slept better-was able to get back to sleep when I woke up the few times.   Patient is accompained by: Family member  wife, Troy Cox   Pertinent History history of back pain   Patient Stated Goals Pt's goal for therapy is to improve walking.   Currently in Pain? No/denies                         [facility] Adult PT Treatment/Exercise - 10/30/16 0001      Exercises   Exercises Other Exercises   Other Exercises  Standing lumbar extension stretch, x 3 reps.  Abdominal activation while standing-cues provided for tightening abdominal muscles/upright posture to prevent forward flexion           [facility]) - 10/30/16 5681    PWR! exercises Moves in standing;Moves in prone   PWR! Up x 10 reps   PWR! Rock x 5 reps each side   PWR! Twist x 5 reps each side   Comments PWR! Moves in prone performed to improve postural strengthening   PWR! Up x 20   PWR! Rock x 20   PWR! Twist x 20   PWR Step x 20   Comments PWR! Standing on solid  surface x20, then on foam surface x 20; activities on foam surface performed near counter with intermittent UE support; cues for upright posture during standing exercises on foam (with upright posture-decreased LLE tremor/jerk noted, with increased trunk flexion, increased tremor noted)  Standing on foam:  forward/back step and weightshift x 15              PT Education - 10/30/16 2018    Education provided Yes   Education Details Upright posture-cues for abdominal activation in standing to aid upright posture (which seems to decrease LLE jerking/tremor)   Person(s) Educated Patient;Spouse   Methods Explanation;Demonstration;Handout   Comprehension Verbalized understanding;Returned demonstration          PT Short Term Goals - 10/21/16 0853      PT SHORT TERM GOAL #1   Title Pt will perform HEP with wife's supervision for improved transfers, balance and gait.  TARGET 10/24/15   Time 4   Period Weeks   Status Achieved     PT SHORT TERM GOAL #2   Title Pt will improve 5x sit<>stand to less than or equal to 20 seconds for decreased fall risk.   Baseline 5x sit<>stand 16.19 sec  10/21/16   Time 4   Period Weeks   Status Achieved     PT SHORT TERM GOAL #3   Title Pt will improve TUG score to less than or equal to 13.5 seconds for decreased fall risk.   Baseline 11.44 sec 10/21/16   Time 4   Period Weeks   Status Achieved     PT SHORT TERM GOAL #4   Title MiniBESTest score to improve to at least 22/28 for decreased fall risk.   Baseline 20/28 on MiniBESTest 10/21/16   Time 4   Period Weeks   Status Not Met     PT SHORT TERM GOAL #5   Title Pt/wife will verbalize understanding of local Parkinson's disease resources.   Time 4   Period Weeks   Status Achieved           PT Long Term Goals - 09/24/16 2232      PT LONG TERM GOAL #1   Title Pt/wife will verbalize understanding of fall prevention in home environment.  TARGET 11/23/16   Time 8   Period Weeks   Status New      PT LONG TERM GOAL #2   Title Pt will improve 5x sit<>stand test to less than or equal to 15 seconds for improved efficiency/safety with transfers.   Time 8   Period Weeks   Status New     PT LONG TERM GOAL #3   Title Pt will improve gait velocity to at least 2.62 ft/sec for improved gait efficiency and safety.   Time 8   Period Weeks   Status New     PT LONG TERM GOAL #4   Title Pt will ambulate at least 1000 ft, indoor and outdoor surfaces, modified independently, no LOB, for improved community gait.     Time 8   Period Weeks   Status New     PT LONG TERM GOAL #5   Title Pt will verbalize plans for continued community fitness upon D/C from PT.   Time 8   Period Weeks   Status New               Plan - 10/30/16 2019    Clinical Impression Statement During PT session today, pt/PT noted that when pt has increased forward flexion through trunk, he has increased episodes of LLE tremors/jerkiness.  Likewise, with more focused attention on fully upright posture, decreased tremors noted.  Discussed standing and prone means for upright posture.   Rehab Potential Good   PT Frequency 2x / week   PT Duration 8 weeks  plus eval   PT Treatment/Interventions ADLs/Self Care Home Management;Functional mobility training;Gait training;DME Instruction;Patient/family education;Neuromuscular re-education;Balance training;Therapeutic exercise;Therapeutic activities   PT Next Visit Plan Continue to work on dynamic balance and gait (dual tasking activities), compliant surfaces, posture exercises   PT Home Exercise Plan Current HEP:  PWR! Moves in sitting, PWR! Moves in standing   Consulted and Agree with Plan of Care Patient;Family member/caregiver   Family Member Consulted wife      Patient will benefit from skilled therapeutic intervention in order to improve the following deficits and impairments:  Abnormal gait, Decreased balance, Decreased mobility, Decreased strength, Difficulty  walking, Postural dysfunction  Visit Diagnosis: Unsteadiness on feet  Abnormal posture     Problem List There are no active problems to display for this patient.   Mikka Kissner W. 10/30/2016, 8:22 PM  Frazier Butt., PT  [facility] [address]  [place], [place], [address] Phone: [phone]   Fax:  [phone]  Name: Troy Cox MRN: [id number] Date of Birth: 1941/04/26

## 2016-11-03 ENCOUNTER — Ambulatory Visit: Payer: Medicare Other | Admitting: Physical Therapy

## 2016-11-03 DIAGNOSIS — R2689 Other abnormalities of gait and mobility: Secondary | ICD-10-CM | POA: Diagnosis not present

## 2016-11-03 DIAGNOSIS — R293 Abnormal posture: Secondary | ICD-10-CM

## 2016-11-03 DIAGNOSIS — R2681 Unsteadiness on feet: Secondary | ICD-10-CM | POA: Diagnosis not present

## 2016-11-03 NOTE — Patient Instructions (Signed)

## 2016-11-03 NOTE — Therapy (Signed)
[facility] [address] [place], [place], [address] Phone: [phone]   Fax:  [phone]  Physical Therapy Treatment  Patient Details  Name: Troy Cox MRN: [id number] Date of Birth: [birth year] Referring Provider: Jeannie Fend [facility])  Encounter Date: 11/03/2016      PT End of Session - 11/03/16 2046    Visit Number 13   Number of Visits 16   Date for PT Re-Evaluation 11/23/16   Authorization Type Medicare primary; [employer] 2nd; (GCODE every 10th visit)   PT Start Time 1020   PT Stop Time 1103   PT Time Calculation (min) 43 min   Activity Tolerance Patient tolerated treatment well   Behavior During Therapy [facility] for tasks assessed/performed      No past medical history on file.  No past surgical history on file.  There were no vitals filed for this visit.      Subjective Assessment - 11/03/16 1022    Subjective No changes, no falls over the weekend.  Not really noticing significant changes with sleeping medication.   Patient is accompained by: Family member  wife, Troy Cox   Pertinent History history of back pain   Patient Stated Goals Pt's goal for therapy is to improve walking.   Currently in Pain? No/denies                         [facility] Adult PT Treatment/Exercise - 11/03/16 1024      Ambulation/Gait   Ambulation/Gait Yes   Ambulation/Gait Assistance 6: Modified independent (Device/Increase time)   Ambulation/Gait Assistance Details Cues for upright posture   Ambulation Distance (Feet) 800 Feet  then 120 ft   Assistive device None   Gait Pattern Step-through pattern;Decreased arm swing - right;Decreased step length - right;Poor foot clearance - right   Ambulation Surface Level;Indoor   Pre-Gait Activities Forward/back gait    Gait Comments Gait training activities with conversational tasks and with cognitive task of naming foods (pt able to name through letter 'i' in 400  ft, with cues)     Self-Care   Self-Care Other Self-Care Comments   Other Self-Care Comments  Discussed POC (as pt has only one visit scehduled after today).  Pt feels he has improved walking and posture overall since beginning therapy.  Reports that the exercises are helping.  Discussed options for optimal community exercise upon d/c from PT     Exercises   Exercises Knee/Hip     Knee/Hip Exercises: Aerobic   Stepper Seated SciFit Stepper, x 8:45, 4 extremities, with initial RPM at 24, with increase to consistent RPM of >70 with cues for increased intensity (Level 2.2 intensity)     Self Care Continued: -Discussed optimal fitness:  Therapy exercises as part of HEP to be performed daily, walking for exercise, and aerobic exercise.  Also provided handouts on [employer], [employer]WR! Moves exercise classes, [employer], and [employer] (pt to check to see if insurance participates).  Discussed options closer to his home for aerobic type exercise, including [employer] and [place] fitness center.    Discussed rating intensity of aerobic exercise, discussed increased focus and attention to task to be able to improve RPM and stay at consistently higher speed.           PT Education - 11/03/16 2045    Education provided Yes   Education Details Optimal fitness options, community fitness ideas:  [employer], [place], [place]!  Moves, Silver Sneakers, use of aerobic machines   Person(s) Educated Patient;Spouse   Methods Explanation;Demonstration;Handout   Comprehension Verbalized understanding          PT Short Term Goals - 10/21/16 0853      PT SHORT TERM GOAL #1   Title Pt will perform HEP with wife's supervision for improved transfers, balance and gait.  TARGET 10/24/15   Time 4   Period Weeks   Status Achieved     PT SHORT TERM GOAL #2   Title Pt will improve 5x sit<>stand to less than or equal to 20 seconds for decreased fall risk.   Baseline 5x sit<>stand 16.19 sec  10/21/16   Time 4   Period Weeks   Status Achieved     PT SHORT TERM GOAL #3   Title Pt will improve TUG score to less than or equal to 13.5 seconds for decreased fall risk.   Baseline 11.44 sec 10/21/16   Time 4   Period Weeks   Status Achieved     PT SHORT TERM GOAL #4   Title MiniBESTest score to improve to at least 22/28 for decreased fall risk.   Baseline 20/28 on MiniBESTest 10/21/16   Time 4   Period Weeks   Status Not Met     PT SHORT TERM GOAL #5   Title Pt/wife will verbalize understanding of local Parkinson's disease resources.   Time 4   Period Weeks   Status Achieved           PT Long Term Goals - 09/24/16 2232      PT LONG TERM GOAL #1   Title Pt/wife will verbalize understanding of fall prevention in home environment.  TARGET 11/23/16   Time 8   Period Weeks   Status New     PT LONG TERM GOAL #2   Title Pt will improve 5x sit<>stand test to less than or equal to 15 seconds for improved efficiency/safety with transfers.   Time 8   Period Weeks   Status New     PT LONG TERM GOAL #3   Title Pt will improve gait velocity to at least 2.62 ft/sec for improved gait efficiency and safety.   Time 8   Period Weeks   Status New     PT LONG TERM GOAL #4   Title Pt will ambulate at least 1000 ft, indoor and outdoor surfaces, modified independently, no LOB, for improved community gait.     Time 8   Period Weeks   Status New     PT LONG TERM GOAL #5   Title Pt will verbalize plans for continued community fitness upon D/C from PT.   Time 8   Period Weeks   Status New               Plan - 11/03/16 2048    Clinical Impression Statement Began session by discussion POC/possible discharge as pt has one additional visit scheduled beyond today.  He is not opposed to discharge next visit.  Discussed in detail and provided information on various community resources for continued exercise options upon D/C from PT.  Will plan to assess LTGs and discuss d/c next  visit.   Rehab Potential Good   PT Frequency 2x / week   PT Duration 8 weeks  plus eval   PT Treatment/Interventions ADLs/Self Care Home Management;Functional mobility training;Gait training;DME Instruction;Patient/family education;Neuromuscular re-education;Balance training;Therapeutic exercise;Therapeutic activities   PT Next Visit Plan Follow-up on ideas for community fitness;  discuss discharge and check remaining LTGs   PT Home Exercise Plan Current HEP:  P[employer]! Moves in sitting, PWR! Moves in standing   Consulted and Agree with Plan of Care Patient;Family member/caregiver   Family Member Consulted wife      Patient will benefit from skilled therapeutic intervention in order to improve the following deficits and impairments:  Abnormal gait, Decreased balance, Decreased mobility, Decreased strength, Difficulty walking, Postural dysfunction  Visit Diagnosis: Other abnormalities of gait and mobility  Abnormal posture     Problem List There are no active problems to display for this patient.   Alnisa Hasley W. 11/03/2016, 8:53 PM  Frazier Butt., PT  [facility] [address] [place], [place], [address] Phone: [phone]   Fax:  [phone]  Name: Troy Cox MRN: [id number] Date of Birth: 1940/11/23

## 2016-11-04 ENCOUNTER — Ambulatory Visit: Payer: Medicare Other | Admitting: Physical Therapy

## 2016-11-04 DIAGNOSIS — R2689 Other abnormalities of gait and mobility: Secondary | ICD-10-CM

## 2016-11-04 DIAGNOSIS — R293 Abnormal posture: Secondary | ICD-10-CM

## 2016-11-04 DIAGNOSIS — R2681 Unsteadiness on feet: Secondary | ICD-10-CM | POA: Diagnosis not present

## 2016-11-04 NOTE — Therapy (Signed)
[facility] [address] [place], [place], [address] Phone: [phone]   Fax:  [phone]  Physical Therapy Treatment  Patient Details  Name: Troy Cox MRN: [id number] Date of Birth: [birth year] Referring Provider: Anthony Sar [facility])  Encounter Date: 11/04/2016      PT End of Session - 11/04/16 1720    Visit Number 14   Number of Visits 16   Date for PT Re-Evaluation 11/23/16   Authorization Type Medicare primary; [employer] 2nd; (GCODE every 10th visit)   PT Start Time [phone]   PT Stop Time 0930   PT Time Calculation (min) 38 min   Activity Tolerance Patient tolerated treatment well   Behavior During Therapy [facility] for tasks assessed/performed      No past medical history on file.  No past surgical history on file.  There were no vitals filed for this visit.      Subjective Assessment - 11/04/16 0855    Subjective Did not sleep well last night.  Looked over the community fitness information, and likely the [employer] and church ex room may be the best option.     Patient is accompained by: Family member  wife, Shelba Flake   Pertinent History history of back pain   Patient Stated Goals Pt's goal for therapy is to improve walking.   Currently in Pain? No/denies                         [facility] Adult PT Treatment/Exercise - 11/04/16 0856      Transfers   Transfers Sit to Stand;Stand to Sit   Sit to Stand 6: Modified independent (Device/Increase time);Without upper extremity assist;From chair/3-in-1   Five time sit to stand comments  11.72   Stand to Sit 6: Modified independent (Device/Increase time);Without upper extremity assist;To chair/3-in-1     Ambulation/Gait   Ambulation/Gait Yes   Ambulation/Gait Assistance 6: Modified independent (Device/Increase time)   Ambulation/Gait Assistance Details Cues provided for negotiating inclines/hills on grassy surfaces   Ambulation Distance (Feet) 1000  Feet   Assistive device None   Gait Pattern Step-through pattern;Decreased arm swing - right;Decreased step length - right;Poor foot clearance - right   Ambulation Surface Level;Indoor;Unlevel;Outdoor;Paved;Grass   Gait velocity 8.57 sec = 3.83 ft/sec   Curb 7: Independent     Timed Up and Go Test   TUG Normal TUG;Cognitive TUG   Normal TUG (seconds) 9.53   Cognitive TUG (seconds) 10.1     Self-Care   Self-Care Other Self-Care Comments   Other Self-Care Comments  Discussed/reviewed information provided yesterday for community fitness options and pt feels that [employer] and nearby church may be best options for continued fitness activities.  Discussed progress towards goals and pt/wife/PT in agreement with discharge this visit.  Reviewed information on fall prevention and pt verbalizes understanding from several visits ago.  Discussed PT, OT, speech screens in 6 months and pt in agreement.     Pt questions need for OT/speech screens, but discussed importance of early interventions if needed/establishing baselines with recommendations made to physician if therapies are warranted.  Pt/wife in agreement regarding screens.   -Reviewed fall prevention, including pt reporting changes they have made in home since initial discussion of fall prevention.  Discussed upright posture/possible correlation to decreased tremors.           PT Education - 11/04/16 1719    Education provided Yes   Education Details Reviewed community fitness options,  fall prevention, discussed discharge plans, progress towards goals-see self care   Person(s) Educated Patient;Spouse   Methods Explanation   Comprehension Verbalized understanding          PT Short Term Goals - 10/21/16 0853      PT SHORT TERM GOAL #1   Title Pt will perform HEP with wife's supervision for improved transfers, balance and gait.  TARGET 10/24/15   Time 4   Period Weeks   Status Achieved     PT SHORT TERM GOAL #2   Title Pt will  improve 5x sit<>stand to less than or equal to 20 seconds for decreased fall risk.   Baseline 5x sit<>stand 16.19 sec 10/21/16   Time 4   Period Weeks   Status Achieved     PT SHORT TERM GOAL #3   Title Pt will improve TUG score to less than or equal to 13.5 seconds for decreased fall risk.   Baseline 11.44 sec 10/21/16   Time 4   Period Weeks   Status Achieved     PT SHORT TERM GOAL #4   Title MiniBESTest score to improve to at least 22/28 for decreased fall risk.   Baseline 20/28 on MiniBESTest 10/21/16   Time 4   Period Weeks   Status Not Met     PT SHORT TERM GOAL #5   Title Pt/wife will verbalize understanding of local Parkinson's disease resources.   Time 4   Period Weeks   Status Achieved           PT Long Term Goals - 11/04/16 0906      PT LONG TERM GOAL #1   Title Pt/wife will verbalize understanding of fall prevention in home environment.  TARGET 11/23/16   Time 8   Period Weeks   Status Achieved     PT LONG TERM GOAL #2   Title Pt will improve 5x sit<>stand test to less than or equal to 15 seconds for improved efficiency/safety with transfers.   Time 8   Period Weeks   Status Achieved     PT LONG TERM GOAL #3   Title Pt will improve gait velocity to at least 2.62 ft/sec for improved gait efficiency and safety.   Time 8   Period Weeks   Status Achieved     PT LONG TERM GOAL #4   Title Pt will ambulate at least 1000 ft, indoor and outdoor surfaces, modified independently, no LOB, for improved community gait.     Time 8   Period Weeks   Status Achieved     PT LONG TERM GOAL #5   Title Pt will verbalize plans for continued community fitness upon D/C from PT.   Time 8   Period Weeks   Status Achieved               Plan - 11/04/16 1721    Clinical Impression Statement Assessed LTGs, with pt meeting/surpassing all LTGs.  Pt demo significant improvement of all functional mobility scores since evaluation, with overall ease of movement and  decreased fall risk.  Have provided extensive options for continued community fitness, with pt planning to follow up on options.  Pt is appropriate for d/c at this time.   Rehab Potential Good   PT Frequency 2x / week   PT Duration 8 weeks  plus eval   PT Treatment/Interventions ADLs/Self Care Home Management;Functional mobility training;Gait training;DME Instruction;Patient/family education;Neuromuscular re-education;Balance training;Therapeutic exercise;Therapeutic activities   PT Next Visit Plan Discharge this visit;  plan for PT, OT, speech therapy screen in 6 months   PT Home Exercise Plan Current HEP:  PWR! Moves in sitting, PWR! Moves in standing   Consulted and Agree with Plan of Care Patient;Family member/caregiver   Family Member Consulted wife      Patient will benefit from skilled therapeutic intervention in order to improve the following deficits and impairments:  Abnormal gait, Decreased balance, Decreased mobility, Decreased strength, Difficulty walking, Postural dysfunction  Visit Diagnosis: Other abnormalities of gait and mobility  Abnormal posture       G-Codes - November 25, 2016 1723    Functional Assessment Tool Used (Outpatient Only) 5x sit<>stand 11.72 sec, gait velocity 3.83 ft/sec, TUG 9.53 sec, TUG cog 10.10 sec   Functional Limitation Mobility: Walking and moving around   Mobility: Walking and Moving Around Goal Status [phone]) At least 1 percent but less than 20 percent impaired, limited or restricted   Mobility: Walking and Moving Around Discharge Status [phone]) At least 1 percent but less than 20 percent impaired, limited or restricted      Problem List There are no active problems to display for this patient.   MARRIOTT,AMY W. 11-25-2016, 5:24 PM  Frazier Butt., [place] [address] [place], [place], [address] Phone: [phone]   Fax:  [phone]  Name: ROBERTA KELLY MRN:  [id number] Date of Birth: March 21, 1941   PHYSICAL THERAPY DISCHARGE SUMMARY  Visits from Start of Care: 14  Current functional level related to goals / functional outcomes: Pt has met all LTGs-see above   Remaining deficits: Needs reminders for postural correction and for intensity of movement patterns to offset bradykinesia, posture abnormality, gait instability.   Education / Equipment: Educated in [employer],  Fall prevention, community fitness. Plan: Patient agrees to discharge.  Patient goals were met. Patient is being discharged due to meeting the stated rehab goals.  ?????  Recommend PT, OT, speech therapy screens in 6 months.  Mady Haagensen, PT November 25, 2016 5:29 PM Phone: [phone] Fax: [phone]

## 2016-12-03 ENCOUNTER — Emergency Department (HOSPITAL_COMMUNITY): Payer: Medicare Other

## 2016-12-03 ENCOUNTER — Emergency Department (HOSPITAL_COMMUNITY)
Admission: EM | Admit: 2016-12-03 | Discharge: 2016-12-03 | Disposition: A | Discharge status: Home / Self Care | Payer: Medicare Other | Attending: Emergency Medicine | Admitting: Emergency Medicine

## 2016-12-03 ENCOUNTER — Encounter (HOSPITAL_COMMUNITY): Payer: Self-pay | Admitting: Emergency Medicine

## 2016-12-03 DIAGNOSIS — Z79899 Other long term (current) drug therapy: Secondary | ICD-10-CM | POA: Insufficient documentation

## 2016-12-03 DIAGNOSIS — Y939 Activity, unspecified: Secondary | ICD-10-CM | POA: Insufficient documentation

## 2016-12-03 DIAGNOSIS — S8001XA Contusion of right knee, initial encounter: Secondary | ICD-10-CM | POA: Diagnosis not present

## 2016-12-03 DIAGNOSIS — S0003XA Contusion of scalp, initial encounter: Secondary | ICD-10-CM | POA: Diagnosis not present

## 2016-12-03 DIAGNOSIS — S60221A Contusion of right hand, initial encounter: Secondary | ICD-10-CM | POA: Diagnosis not present

## 2016-12-03 DIAGNOSIS — Z23 Encounter for immunization: Secondary | ICD-10-CM | POA: Insufficient documentation

## 2016-12-03 DIAGNOSIS — Y999 Unspecified external cause status: Secondary | ICD-10-CM | POA: Diagnosis not present

## 2016-12-03 DIAGNOSIS — R52 Pain, unspecified: Secondary | ICD-10-CM

## 2016-12-03 DIAGNOSIS — Z7982 Long term (current) use of aspirin: Secondary | ICD-10-CM | POA: Diagnosis not present

## 2016-12-03 DIAGNOSIS — W1839XA Other fall on same level, initial encounter: Secondary | ICD-10-CM | POA: Diagnosis not present

## 2016-12-03 DIAGNOSIS — G936 Cerebral edema: Secondary | ICD-10-CM | POA: Diagnosis not present

## 2016-12-03 DIAGNOSIS — E041 Nontoxic single thyroid nodule: Secondary | ICD-10-CM | POA: Diagnosis not present

## 2016-12-03 DIAGNOSIS — S199XXA Unspecified injury of neck, initial encounter: Secondary | ICD-10-CM | POA: Diagnosis not present

## 2016-12-03 DIAGNOSIS — R51 Headache: Secondary | ICD-10-CM | POA: Diagnosis not present

## 2016-12-03 DIAGNOSIS — R220 Localized swelling, mass and lump, head: Secondary | ICD-10-CM | POA: Diagnosis not present

## 2016-12-03 DIAGNOSIS — G20 Parkinson's disease: Secondary | ICD-10-CM | POA: Insufficient documentation

## 2016-12-03 DIAGNOSIS — C7931 Secondary malignant neoplasm of brain: Secondary | ICD-10-CM

## 2016-12-03 DIAGNOSIS — M542 Cervicalgia: Secondary | ICD-10-CM | POA: Insufficient documentation

## 2016-12-03 DIAGNOSIS — S064X0A Epidural hemorrhage without loss of consciousness, initial encounter: Secondary | ICD-10-CM | POA: Diagnosis not present

## 2016-12-03 DIAGNOSIS — S0990XA Unspecified injury of head, initial encounter: Secondary | ICD-10-CM | POA: Diagnosis present

## 2016-12-03 DIAGNOSIS — Y929 Unspecified place or not applicable: Secondary | ICD-10-CM | POA: Insufficient documentation

## 2016-12-03 DIAGNOSIS — S098XXA Other specified injuries of head, initial encounter: Secondary | ICD-10-CM | POA: Diagnosis not present

## 2016-12-03 HISTORY — DX: Parkinson's disease: G20

## 2016-12-03 HISTORY — DX: Parkinson's disease without dyskinesia, without mention of fluctuations: G20.A1

## 2016-12-03 LAB — BASIC METABOLIC PANEL
ANION GAP: 10 (ref 5–15)
BUN: 22 mg/dL — ABNORMAL HIGH (ref 6–20)
CALCIUM: 9.4 mg/dL (ref 8.9–10.3)
CHLORIDE: 105 mmol/L (ref 101–111)
CO2: 29 mmol/L (ref 22–32)
Creatinine, Ser: 1.46 mg/dL — ABNORMAL HIGH (ref 0.61–1.24)
GFR calc Af Amer: 53 mL/min — ABNORMAL LOW (ref >60)
GFR calc non Af Amer: 46 mL/min — ABNORMAL LOW (ref >60)
Glucose, Bld: 119 mg/dL — ABNORMAL HIGH (ref 65–99)
Potassium: 3.7 mmol/L (ref 3.5–5.1)
SODIUM: 144 mmol/L (ref 135–145)

## 2016-12-03 LAB — CBC WITH DIFFERENTIAL/PLATELET
BASOS ABS: 0 10*3/uL (ref 0.0–0.1)
Basophils Relative: 0 %
EOS PCT: 1 %
Eosinophils Absolute: 0.1 10*3/uL (ref 0.0–0.7)
HEMATOCRIT: 46.9 % (ref 39.0–52.0)
HEMOGLOBIN: 16.3 g/dL (ref 13.0–17.0)
LYMPHS ABS: 1.1 10*3/uL (ref 0.7–4.0)
LYMPHS PCT: 10 %
MCH: 30.8 pg (ref 26.0–34.0)
MCHC: 34.8 g/dL (ref 30.0–36.0)
MCV: 88.5 fL (ref 78.0–100.0)
MONO ABS: 0.7 10*3/uL (ref 0.1–1.0)
Monocytes Relative: 7 %
Neutro Abs: 8.5 10*3/uL — ABNORMAL HIGH (ref 1.7–7.7)
Neutrophils Relative %: 82 %
Platelets: 201 10*3/uL (ref 150–400)
RBC: 5.3 MIL/uL (ref 4.22–5.81)
RDW: 13.1 % (ref 11.5–15.5)
WBC: 10.4 10*3/uL (ref 4.0–10.5)

## 2016-12-03 MED ORDER — DEXAMETHASONE SODIUM PHOSPHATE 10 MG/ML IJ SOLN
10.0000 mg | Freq: Once | INTRAMUSCULAR | Status: AC
  Administered 2016-12-03: 10 mg via INTRAVENOUS
  Filled 2016-12-03: qty 1

## 2016-12-03 MED ORDER — TETANUS-DIPHTH-ACELL PERTUSSIS 5-2.5-18.5 LF-MCG/0.5 IM SUSP
0.5000 mL | Freq: Once | INTRAMUSCULAR | Status: AC
  Administered 2016-12-03: 0.5 mL via INTRAMUSCULAR
  Filled 2016-12-03: qty 0.5

## 2016-12-03 MED ORDER — LORAZEPAM 2 MG/ML IJ SOLN
1.0000 mg | Freq: Once | INTRAMUSCULAR | Status: AC
  Administered 2016-12-03: 1 mg via INTRAVENOUS
  Filled 2016-12-03: qty 1

## 2016-12-03 MED ORDER — IOPAMIDOL (ISOVUE-300) INJECTION 61%
INTRAVENOUS | Status: AC
  Administered 2016-12-03: 100 mL via INTRAVENOUS
  Filled 2016-12-03: qty 100

## 2016-12-03 MED ORDER — GADOBENATE DIMEGLUMINE 529 MG/ML IV SOLN
19.0000 mL | Freq: Once | INTRAVENOUS | Status: AC | PRN
  Administered 2016-12-03: 19 mL via INTRAVENOUS

## 2016-12-03 MED ORDER — SODIUM CHLORIDE 0.9 % IV BOLUS (SEPSIS)
500.0000 mL | Freq: Once | INTRAVENOUS | Status: AC
  Administered 2016-12-03: 500 mL via INTRAVENOUS

## 2016-12-03 MED ORDER — DEXAMETHASONE 6 MG PO TABS: 6.0000 mg | ORAL_TABLET | Freq: Four times a day (QID) | ORAL | 0 refills | Status: AC

## 2016-12-03 MED ORDER — IOPAMIDOL (ISOVUE-300) INJECTION 61%: 100.0000 mL | Freq: Once | INTRAVENOUS | Status: DC | PRN

## 2016-12-03 NOTE — ED Provider Notes (Signed)
Received patient in signout from Dr. Mariane Masters, patient had a CT scan that was concerning for possible bleed versus cancer versus an for MRI. 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 Discussed the case with Dr. Burr Medico.  Patient currently has no source of his cancer. She recommends a CT scan of his chest abdomen pelvis to further evaluate. As the patient has extensive cerebral edema she recommended giving him a dose of 10 mg IV Decadron and discharged with 6 mg of Decadron 4 times a day.  CT with thyroid nodule, no other signs of CA. Patient and family are ready to go home. Discussed that they need to follow-up with both oncology and radiation oncology.   Deno Etienne, DO 12/03/16 2359

## 2016-12-03 NOTE — Discharge Instructions (Signed)
Follow up with Oncology.  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

## 2016-12-03 NOTE — ED Triage Notes (Signed)
Per EMS, pt fell outside, falling face forward onto rocks. Pt has abrasion to head, left shoulder, left knee. Pt has hx of parkinson's disease. Pt denies loss of consciousness.

## 2016-12-03 NOTE — ED Notes (Signed)
Patient transported to MRI 

## 2016-12-04 ENCOUNTER — Other Ambulatory Visit (HOSPITAL_BASED_OUTPATIENT_CLINIC_OR_DEPARTMENT_OTHER): Payer: Medicare Other

## 2016-12-04 ENCOUNTER — Encounter: Payer: Self-pay | Admitting: Hematology

## 2016-12-04 ENCOUNTER — Telehealth: Payer: Self-pay | Admitting: Hematology

## 2016-12-04 ENCOUNTER — Ambulatory Visit (HOSPITAL_BASED_OUTPATIENT_CLINIC_OR_DEPARTMENT_OTHER): Payer: Medicare Other | Admitting: Hematology

## 2016-12-04 DIAGNOSIS — N183 Chronic kidney disease, stage 3 (moderate): Secondary | ICD-10-CM | POA: Diagnosis not present

## 2016-12-04 DIAGNOSIS — C7931 Secondary malignant neoplasm of brain: Secondary | ICD-10-CM

## 2016-12-04 DIAGNOSIS — C801 Malignant (primary) neoplasm, unspecified: Secondary | ICD-10-CM | POA: Diagnosis not present

## 2016-12-04 DIAGNOSIS — G20 Parkinson's disease: Secondary | ICD-10-CM

## 2016-12-04 LAB — LACTATE DEHYDROGENASE: LDH: 147 U/L (ref 125–245)

## 2016-12-04 NOTE — Telephone Encounter (Signed)
Cld the pt to schedule an appt. Appt has been scheduled for Troy Cox to see Dr. Burr Medico on 4/27. He agreed to the appt date and time. Aware to arrie 30 minutes early. Voiced understanding.

## 2016-12-04 NOTE — Progress Notes (Signed)
[place]  [phone] [phone]  Clinic New Consult Note   Patient Care Team: No Pcp Per Patient as PCP - General (General Practice) 12/04/2016   REFERRAL PHYSICIAN: Dr. Karle Barr ED  CHIEF COMPLAINTS/PURPOSE OF CONSULTATION:  Multiple (2)  brain metastases (unknown primary)  HISTORY OF PRESENTING ILLNESS:  Troy Cox 76 y.o. male with a past medical history of Parkinson's Disease is here because of 2 brain lesions noted on scans after a fall when he presented to the ED. He presents to the clinic with his wife, son, and daughter. The patient's son and daughter live in [place].  The patient presented to the ED on 12/03/16 for a fall. The patient has Parkinson's disease which was diagnosed early this year. He has had progressive difficulty of balance and walking in the past few months, has had multiple fall in the past few weeks, and is most wheelchair-bound now, and limited ability to walk with a walker. He was brought to ED last night after a fall. The patient fell headfirst on this occasional. A CT scan of the head and cervical spine was ordered and it showed a large amount of white matter edema seen in the right frontal lobe concerning for underlying neoplasm or possible infarction. The subsequent brain MRI showed a 2.1 x 2.7 cm right frontal and 1.1 x 1.1 cm right parietal lobe masses concerning for metastasis with extensive vasogenic edema extending to the contralateral cerebrum concerning for small metastatic deposits. CT CAP was then performed to find a primary site of metastatic disease. This noted a 1.5 cm left thyroid nodule, cholelithiasis, diverticulosis, and stable mild prostatic enlargement. As the patient has extensive cerebral edema he was given a dose of 10 mg IV Decadron in the hospital and discharged with 6 mg of Decadron 4 times a day.  His daughter reported that he showed Parkinson's symptoms that have been present for approximately  10 years, but he was not officially diagnosed until earlier this year. He would have right arm and leg weakness and right arm tremors. He had 5 fall recently. He has history of a "bad back" that has required surgery. He has a loss of voice. The patient's family report he has memory issues. They state he now has a whole body tremor when he wakes up and his hand tremors have become worse.Marland Kitchen He now has full body weakness and needs help getting into the car. He had headaches that stared in February, but doesn't remember which side. It came and went. The headaches are not getting worse. Family reports his confusion and memory has worsened.. He has been taking dopamine for the past two months with only his voice improving. Denies diplopia. He has hearing aids which he had for years. Denies SOB, cough, chest pain, weight loss, night sweats, abdominal pain, or abdominal bloating. He has constipation. The patient's daughter reports he has depression and anxiety. The patient is on Decadron 6 mg QID. The patient reports having "spots" removed from his face, but has never seen a dermatologist.   MEDICAL HISTORY:  Past Medical History:  Diagnosis Date  . Anxiety   . Depression   . Hypertension   . Parkinson disease ([place])     SURGICAL HISTORY: Past Surgical History:  Procedure Laterality Date  . BACK SURGERY    . HERNIA REPAIR      SOCIAL HISTORY: Social History   Social History  . Marital status: Married    Spouse name: N/A  .  Number of children: N/A  . Years of education: N/A   Occupational History  . Not on file.   Social History Main Topics  . Smoking status: Never Smoker  . Smokeless tobacco: Never Used  . Alcohol use No  . Drug use: No  . Sexual activity: No   Other Topics Concern  . Not on file   Social History Narrative  . No narrative on file    FAMILY HISTORY: Family History  Problem Relation Age of Onset  . Cancer Maternal Aunt     unknown type cancer     ALLERGIES:   is allergic to gabapentin (once-daily).  MEDICATIONS:  Current Outpatient Prescriptions  Medication Sig Dispense Refill  . ALPRAZolam (XANAX) 0.25 MG tablet Take 0.25 mg by mouth 3 (three) times daily as needed for anxiety.    Marland Kitchen aspirin EC 81 MG tablet Take 81 mg by mouth daily.    . carbidopa-levodopa (SINEMET IR) 25-100 MG tablet Take 2 tablets by mouth 3 (three) times daily.    . hydrochlorothiazide (MICROZIDE) 12.5 MG capsule Take 12.5 mg by mouth daily.    . mirtazapine (REMERON) 15 MG tablet Take 15 mg by mouth at bedtime as needed for sleep.    . Multiple Vitamin (MULTIVITAMIN WITH MINERALS) TABS tablet Take 1 tablet by mouth daily.    . naproxen (NAPROSYN) 500 MG tablet Take 500 mg by mouth 2 (two) times daily as needed.     . simvastatin (ZOCOR) 10 MG tablet Take 10 mg by mouth at bedtime.    Marland Kitchen dexamethasone (DECADRON) 6 MG tablet Take 1 tablet (6 mg total) by mouth 4 (four) times daily. (Patient not taking: Reported on 12/04/2016) 120 tablet 0   No current facility-administered medications for this visit.     REVIEW OF SYSTEMS: Constitutional: Denies fevers, chills or abnormal night sweats Eyes: Denies blurriness of vision, double vision or watery eyes Ears, nose, mouth, throat, and face: Denies mucositis or sore throat Respiratory: Denies cough, dyspnea or wheezes Cardiovascular: Denies palpitation, chest discomfort or lower extremity swelling Gastrointestinal:  Denies nausea, heartburn (+) Constipation Skin: Denies abnormal skin rashes Lymphatics: Denies new lymphadenopathy or easy bruising Neurological: (+) Generalized weakness, tremors, loss of voice, memory loss, confusion, anxiety, depression. Behavioral/Psych: Mood is stable, no new changes  All other systems were reviewed with the patient and are negative.  PHYSICAL EXAMINATION: ECOG PERFORMANCE STATUS: 3 - Symptomatic, >50% confined to bed  Vitals:   12/04/16 1610  BP: (!) 143/79  Pulse: 89  Resp: 18  Temp:  98.1 F (36.7 C)   Filed Weights   GENERAL:alert, no distress and comfortable SKIN: skin color, texture, turgor are normal, no rashes or significant lesions (+) Lacerations on the left temple, nose, left hand, and left knee EYES: normal, conjunctiva are pink and non-injected, sclera clear EARS: (+) Bilateral hearing aides. OROPHARYNX:no exudate, no erythema and lips, buccal mucosa, and tongue normal  NECK: supple, thyroid normal size, non-tender, without nodularity LYMPH:  no palpable lymphadenopathy in the cervical, axillary or inguinal LUNGS: clear to auscultation and percussion with normal breathing effort HEART: regular rate & rhythm and no murmurs and no lower extremity edema ABDOMEN:abdomen soft, non-tender and normal bowel sounds Musculoskeletal:no cyanosis of digits and no clubbing (+) Presents in a wheelchair (+) 5/5 strength in his extremities, but gait not tested PSYCH: alert & oriented x 3 with fluent speech NEURO: no focal motor/sensory deficits. Vibration test normal and symmetric bilaterally on upper and lower extremities. Gait and  balance was not evaluated due to difficulty walking.   LABORATORY DATA:  I have reviewed the data as listed CBC Latest Ref Rng & Units 12/03/2016  WBC 4.0 - 10.5 K/uL 10.4  Hemoglobin 13.0 - 17.0 g/dL 16.3  Hematocrit 39.0 - 52.0 % 46.9  Platelets 150 - 400 K/uL 201    CMP Latest Ref Rng & Units 12/03/2016  Glucose 65 - 99 mg/dL 119(H)  BUN 6 - 20 mg/dL 22(H)  Creatinine 0.61 - 1.24 mg/dL 1.46(H)  Sodium 135 - 145 mmol/L 144  Potassium 3.5 - 5.1 mmol/L 3.7  Chloride 101 - 111 mmol/L 105  CO2 22 - 32 mmol/L 29  Calcium 8.9 - 10.3 mg/dL 9.4    RADIOGRAPHIC STUDIES: I have personally reviewed the radiological images as listed and agreed with the findings in the report.  CT CAP w contrast 12/03/16 IMPRESSION: Aortic atherosclerosis. 1.5 cm left thyroid nodule. Thyroid ultrasound is recommended for further evaluation. Cholelithiasis  without evidence of inflammation. Sigmoid diverticulosis without inflammation. Stable mild prostatic enlargement. No other significant abnormality seen in the chest, abdomen or pelvis.  MRI brain w wo contrast 12/03/16 IMPRESSION: No discernible contrast enhancement on post gadolinium sequences. Sequences are mildly or moderately motion degraded. 2.1 x 2.7 cm RIGHT frontal and 11 x 11 mm RIGHT parietal lobe masses concerning for metastasis. Extensive vasogenic edema extending to contralateral cerebrum concerning for additional small metastatic deposits. Findings less likely represent primary multicentric brain tumor with infiltrative component. Recommend correlation with recent transfusion and, repeat post gadolinium sequences with contrast.  CT Head/Cervical Spine wo contrast 12/03/16 IMPRESSION: Right frontal subcortical white matter edema is noted concerning for underlying neoplasm or possibly acute infarction. MRI of the brain with and without gadolinium is recommended for further evaluation. Possible small left frontal scalp hematoma is noted. 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 No fracture or spondylolisthesis is noted.  ASSESSMENT & PLAN: 76 y.o. male with Parkinson's disease who presented with worsening balance difficulty and multiple fall, mild intermittent confusion and cognitive dysfunction   1. Multiple (2)  brain metastases (unknown primary) -We reviewed the patient's imaging with MRI of the brain with pt and his family in person, which showing a 2.1 x 2.7 cm right frontal and 1.1 x 1.1 cm right parietal lobe masses concerning for metastasis with extensive vasogenic edema extending to the contralateral cerebrum concerning for small metastatic deposits. Work up CT CAP was then performed to find a primary site of metastatic disease. This noted a 1.5 cm left thyroid nodule, cholelithiasis, diverticulosis, and stable mild prostatic enlargement with  no sign of a possible primary tumor. -We discussed that these brain lesions are likely metastatic disease (with the primary tumor being a lung cancer, GI cancer, or melanoma that is too small to be found on CT scan) or primary CNS lymphoma. Primary brain tumor is less likely given the multifocal disease. -I recommend a PET scan for further evaluation of primary  -We need tissue biopsy which would provide a definitive diagnosis. If PET negative, he will likely have craniotomy and surgical resection/biopsy of the right frontal lesion   -Labs collected for tumor marker testing. CEA, CA 19-9, PSA, LDH, and hepatic function panel PENDING. -The patient is scheduled to consult with radiation oncology, Dr. Tammi Klippel, on 12/07/16. -The patient's son and daughter live in [place] and might want the patient to undergo treatment there. I will probably refer pt to the [facility]. -The patient's case will be discussed  in brain tumor board on 12/07/16. -The patient will continue Decadron 6 mg QID for brain edema, he has not filled the prescription, but will do it today after his visit with me   2. Parkinson's Disease -The patient and family report the patient had Parkinson's like symptoms for over a decade that has progressively worsened. -He was diagnosed on 09/11/16 by Dr. Jeannie Fend. -MRI of the brain on 03/08/2012 was performed due to the patient feeling weak and shaky after an anxiety episode. The MRI was negative. -Likely his worsening neurological symptoms are caused by the brain lesions.  3. CKD, stage III -Cr elevated at 1.46 on 12/04/16 -The patient denies kidney disease. -I advised the patient to avoid dehydration, NSAIDs nephrotoxins. He did receive IV contrast last night.  PLAN: -PET scan in 1-2 weeks -I will call him after the PET scan if negative, or see him back if PET abnormal  -Consultation with rad/onc on 12/07/16. He likely will be referred to neurosurgery    Orders Placed This  Encounter  Procedures  . NM PET Image Initial (PI) Skull Base To Thigh    Standing Status:   Future    Standing Expiration Date:   12/04/2017    Order Specific Question:   Reason for Exam (SYMPTOM  OR DIAGNOSIS REQUIRED)    Answer:   work up for primary tumor    Order Specific Question:   If indicated for the ordered procedure, I authorize the administration of a radiopharmaceutical per Radiology protocol    Answer:   Yes    Order Specific Question:   Preferred imaging location?    Answer:   [facility]    Order Specific Question:   Radiology Contrast Protocol - do NOT remove file path    Answer:   [email or web address]  . Cancer antigen 19-9    Standing Status:   Future    Number of Occurrences:   1    Standing Expiration Date:   01/08/2018  . CEA    Standing Status:   Future    Number of Occurrences:   1    Standing Expiration Date:   01/08/2018  . PSA    Standing Status:   Future    Number of Occurrences:   1    Standing Expiration Date:   01/08/2018  . Lactate dehydrogenase (LDH)    Standing Status:   Future    Number of Occurrences:   1    Standing Expiration Date:   12/04/2017  . Hepatic function panel    Standing Status:   Future    Number of Occurrences:   1    Standing Expiration Date:   12/04/2017    All questions were answered. The patient knows to call the clinic with any problems, questions or concerns. I spent 55 minutes counseling the patient face to face. The total time spent in the appointment was 60 minutes and more than 50% was on counseling.     Truitt Merle, MD 12/04/2016  This document serves as a record of services personally performed by Truitt Merle, MD. It was created on her behalf by Darcus Austin, a trained medical scribe. The creation of this record is based on the scribe's personal observations and the provider's statements to them. This document has been checked and approved by the attending provider.
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
## 2016-12-05 ENCOUNTER — Encounter: Payer: Self-pay | Admitting: Hematology

## 2016-12-05 LAB — CANCER ANTIGEN 19-9: CA 19-9: 15 U/mL (ref 0–35)

## 2016-12-05 LAB — HEPATIC FUNCTION PANEL
ALBUMIN: 4.8 g/dL (ref 3.5–4.8)
ALK PHOS: 79 IU/L (ref 39–117)
ALT: 12 IU/L (ref 0–44)
AST (SGOT): 24 IU/L (ref 0–40)
BILIRUBIN, DIRECT: 0.38 mg/dL (ref 0.00–0.40)
Bilirubin Total: 1.8 mg/dL — ABNORMAL HIGH (ref 0.0–1.2)
TOTAL PROTEIN: 7.1 g/dL (ref 6.0–8.5)

## 2016-12-05 LAB — PSA: PROSTATE SPECIFIC AG, SERUM: 4.4 ng/mL — AB (ref 0.0–4.0)

## 2016-12-05 NOTE — Progress Notes (Addendum)
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 Patient was diagnosed with Parkinson disease Sep 10, 2016  Past or anticipated interventions, if any, per neurosurgery: no  Past or anticipated interventions, if any, per medical oncology: consulted, referred for consideration of radiation, ordered PET scan, no interventions as of yet recommended for chemotherapy  Dose of Decadron, if applicable: decadron 6 mg qid  Recent neurologic symptoms, if any:   Seizures: whole body tremors when he wakes up and right arm tremors that have improved in the past 2 weeks  Headaches: yes, intermittently  Nausea: no  Dizziness/ataxia: yes  Difficulty with hand coordination: yes  Focal numbness/weakness: no numbness, bilateral weakness worse right arm and leg, right & left arm tremors  Visual deficits/changes: no  Confusion/Memory deficits: yes significant per family, blanking out, staring into space, memory issues, hallucinations  Painful bone metastases at present, if any: bad back that required surgery  SAFETY ISSUES:  Prior radiation? no  Pacemaker/ICD? no  Possible current pregnancy? no  Is the patient on methotrexate? no  Additional Complaints / other details: 76 year old male. Married. Son and daughter live in [place] thus are considering having their father treated at [facility] and possibly having them move since the parents are isolated in their living situation away from neighbors/family/friends. Patients daughter states that he complained of severe headache on right side of head, was disoriented and she gave him Motrin 400mg , Carbidopa-levodopa , Decadron and this seemed to be helpful.  Also evident blood present from penis, in underwear and during urination with burning this morning.   Also examined by ED and found to have a mass on thyroid but no thyroid testing was done.  Also informed that kidney  function was decreased but no further testing ordered especially with presence of blood and urination problems

## 2016-12-07 ENCOUNTER — Ambulatory Visit
Admission: RE | Admit: 2016-12-07 | Discharge: 2016-12-07 | Disposition: A | Discharge status: Home / Self Care | Payer: Medicare Other | Source: Ambulatory Visit | Attending: Radiation Oncology | Admitting: Radiation Oncology

## 2016-12-07 ENCOUNTER — Ambulatory Visit (HOSPITAL_BASED_OUTPATIENT_CLINIC_OR_DEPARTMENT_OTHER)
Admission: RE | Admit: 2016-12-07 | Discharge: 2016-12-07 | Disposition: A | Discharge status: Home / Self Care | Payer: Medicare Other | Source: Ambulatory Visit | Attending: Radiation Oncology | Admitting: Radiation Oncology

## 2016-12-07 ENCOUNTER — Encounter: Payer: Self-pay | Admitting: Radiation Oncology

## 2016-12-07 ENCOUNTER — Other Ambulatory Visit: Payer: Self-pay | Admitting: *Deleted

## 2016-12-07 VITALS — BP 147/84 | HR 85 | Temp 97.6°F | Resp 18 | Wt 204.4 lb

## 2016-12-07 DIAGNOSIS — G20 Parkinson's disease: Secondary | ICD-10-CM | POA: Insufficient documentation

## 2016-12-07 DIAGNOSIS — I10 Essential (primary) hypertension: Secondary | ICD-10-CM | POA: Insufficient documentation

## 2016-12-07 DIAGNOSIS — Z515 Encounter for palliative care: Secondary | ICD-10-CM

## 2016-12-07 DIAGNOSIS — Z7189 Other specified counseling: Secondary | ICD-10-CM | POA: Diagnosis not present

## 2016-12-07 DIAGNOSIS — R310 Gross hematuria: Secondary | ICD-10-CM

## 2016-12-07 DIAGNOSIS — F419 Anxiety disorder, unspecified: Secondary | ICD-10-CM | POA: Insufficient documentation

## 2016-12-07 DIAGNOSIS — G939 Disorder of brain, unspecified: Secondary | ICD-10-CM

## 2016-12-07 DIAGNOSIS — Z79899 Other long term (current) drug therapy: Secondary | ICD-10-CM | POA: Insufficient documentation

## 2016-12-07 DIAGNOSIS — Z7982 Long term (current) use of aspirin: Secondary | ICD-10-CM | POA: Diagnosis not present

## 2016-12-07 DIAGNOSIS — F329 Major depressive disorder, single episode, unspecified: Secondary | ICD-10-CM | POA: Diagnosis not present

## 2016-12-07 DIAGNOSIS — C7931 Secondary malignant neoplasm of brain: Secondary | ICD-10-CM | POA: Diagnosis not present

## 2016-12-07 DIAGNOSIS — C801 Malignant (primary) neoplasm, unspecified: Secondary | ICD-10-CM | POA: Diagnosis not present

## 2016-12-07 LAB — URINALYSIS, MICROSCOPIC - CHCC
Bilirubin (Urine): NEGATIVE
GLUCOSE UR CHCC: NEGATIVE mg/dL
KETONES: NEGATIVE mg/dL
Nitrite: NEGATIVE
Protein: 30 mg/dL
SPECIFIC GRAVITY, URINE: 1.02 (ref 1.003–1.035)
Urobilinogen, UR: 0.2 mg/dL (ref 0.2–1)
pH: 6 (ref 4.6–8.0)

## 2016-12-07 LAB — CEA (IN HOUSE-CHCC): CEA (CHCC-In House): 1.13 ng/mL (ref 0.00–5.00)
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
## 2016-12-07 NOTE — Consult Note (Signed)
Consultation Note Date: 12/07/2016   Patient Name: Troy Cox  DOB: August 05, 1941  MRN: [id number]  Age / Sex: 76 y.o., male  PCP: No Pcp Per Patient Referring Physician: Tyler Pita, MD  Reason for Consultation: Establishing goals of care and Psychosocial/spiritual support  HPI/Patient Profile: 76 y.o. male   h/o multiple (2)  brain metastases (unknown primary)   HISTORY OF PRESENTING ILLNESS:   Troy Cox 76 y.o. male with a past medical history of Parkinson's Disease found to  2 brain lesions noted on scans after a fall when he presented to the ED.   He presents to the clinic with his wife, son, and daughter. The patient's son and daughter live in [place].  The patient presented to the ED on 12/03/16 for a fall. The patient has Parkinson's disease which was diagnosed early this year. He has had progressive difficulty of balance and walking in the past few months, has had multiple fall in the past few weeks, and is most wheelchair-bound now, and limited ability to walk with a walker.  A CT scan of the head and cervical spine was ordered and it showed a large amount of white matter edema seen in the right frontal lobe concerning for underlying neoplasm or possible infarction.   The subsequent brain MRI showed a 2.1 x 2.7 cm right frontal and 1.1 x 1.1 cm right parietal lobe masses concerning for metastasis with extensive vasogenic edema extending to the contralateral cerebrum concerning for small metastatic deposits.   CT CAP was then performed to find a primary site of metastatic disease. This noted a 1.5 cm left thyroid nodule, cholelithiasis, diverticulosis, and stable mild prostatic enlargement.   As the patient has extensive cerebral edema he was given a dose of 10 mg IV Decadron in the hospital and discharged with 6 mg of Decadron 4 times a day.  His daughter reported that he showed  Parkinson's symptoms that have been present for approximately 10 years, but he was not officially diagnosed until earlier this year.    Family reports his confusion and memory has worsened.. He has been taking dopamine for the past two months with only his voice improving.   Family face emotional impact of this new diagnosis, and treatment option decisions. The situation is complicated by the fact that the two children/ main support live in [place] and patient's wife has early signs of dementia.  Clinical Assessment and Goals of Care:  This NP Wadie Lessen reviewed medical records, received report from team, assessed the patient and then meet  the patient and his family in the OP radiation-oncology clinic at Dr Johny Shears reecommendation to discuss diagnosis, prognosis, GOC, and  options.  A  discussion was had today regarding advanced directives.  The difference between a aggressive medical intervention path  and a palliative comfort care path for this patient at this time was had. ( Dr Tammi Klippel detailed various treatment options and logistics)   Values and goals of care important to patient and family were attempted to be  elicited.  MOST form introduced  Concept of Hospice and Palliative Care were discussed   Questions and concerns addressed.   Family encouraged to call with questions or concerns.  PMT will continue to support holistically.   SUMMARY OF RECOMMENDATIONS    Additional Recommendations (Limitations, Scope, Preferences):  At this time decision if to move forward with PET scan on May 9th.  Family is leaning to moving both patient and his wife to [place] area for further evaluation and initiation  treatment there.  Psycho-social/Spiritual:    Additional Recommendations: Education on Hospice   Discussed with patient the importance of continued conversation with family and their  medical providers regarding overall plan of care and treatment options,  ensuring decisions  are within the context of the patients values and GOCs.    Prognosis:   I discussed in detail with daughter [name] the impact of life prolonging measures, specific to artificial hydration and antibiotics, on prognosis, as well as functional ability.    Unable to determine      Primary Diagnoses: Present on Admission: **None**   I have reviewed the medical record, interviewed the patient and family, and examined the patient. The following aspects are pertinent.  Past Medical History:  Diagnosis Date  . Anxiety   . Depression   . Hypertension   . Parkinson disease [facility])    Social History   Social History  . Marital status: Married    Spouse name: N/A  . Number of children: N/A  . Years of education: N/A   Social History Main Topics  . Smoking status: Never Smoker  . Smokeless tobacco: Never Used  . Alcohol use No  . Drug use: No  . Sexual activity: No   Other Topics Concern  . Not on file   Social History Narrative  . No narrative on file   Family History  Problem Relation Age of Onset  . Cancer Maternal Aunt     unknown type cancer    Scheduled Meds: Continuous Infusions: PRN Meds:. Medications Prior to Admission:  Prior to Admission medications   Medication Sig Start Date End Date Taking? Authorizing Provider  ALPRAZolam (XANAX) 0.25 MG tablet Take 0.25 mg by mouth 3 (three) times daily as needed for anxiety.    Historical Provider, MD  aspirin EC 81 MG tablet Take 81 mg by mouth daily.    Historical Provider, MD  carbidopa-levodopa (SINEMET IR) 25-100 MG tablet Take 2 tablets by mouth 3 (three) times daily.    Historical Provider, MD  dexamethasone (DECADRON) 6 MG tablet Take 1 tablet (6 mg total) by mouth 4 (four) times daily. 12/03/16 12/31/2016  Deno Etienne, DO  hydrochlorothiazide (MICROZIDE) 12.5 MG capsule Take 12.5 mg by mouth daily. 09/16/16   Historical Provider, MD  mirtazapine (REMERON) 15 MG tablet Take 15 mg by mouth at bedtime as needed for  sleep. 11/25/16   Historical Provider, MD  Multiple Vitamin (MULTIVITAMIN WITH MINERALS) TABS tablet Take 1 tablet by mouth daily.    Historical Provider, MD  naproxen (NAPROSYN) 500 MG tablet Take 500 mg by mouth 2 (two) times daily as needed.     Historical Provider, MD  simvastatin (ZOCOR) 10 MG tablet Take 10 mg by mouth at bedtime. 11/17/16   Historical Provider, MD   Allergies  Allergen Reactions  . Gabapentin (Once-Daily)     Makes him loopy   Review of Systems  Constitutional: Positive for fatigue.       -h/o falls and unsteady  on feet  Neurological: Positive for weakness.    Physical Exam  Constitutional: He appears well-developed. He appears distressed.  Neurological: He is alert.  Skin: Skin is warm and dry.  Psychiatric:  -tearful during visit    Vital Signs: There were no vitals taken for this visit.         SpO2:   O2 Device:  O2 Flow Rate: .   IO: Intake/output summary: No intake or output data in the 24 hours ending 12/07/16 1733  LBM:   Baseline Weight:   Most recent weight:       Palliative Assessment/Data: 40 %   Discussed with Dr Tammi Klippel  Time In: 1500 Time Out: 1630 Time Total: 75 min Greater than 50%  of this time was spent counseling and coordinating care related to the above assessment and plan.  Signed by: Wadie Lessen, NP   Please contact Palliative Medicine Team phone at [phone] for questions and concerns.  For individual provider: See Shea Evans

## 2016-12-08 ENCOUNTER — Other Ambulatory Visit: Payer: Self-pay | Admitting: *Deleted

## 2016-12-08 ENCOUNTER — Other Ambulatory Visit: Payer: Self-pay | Admitting: Hematology

## 2016-12-08 ENCOUNTER — Other Ambulatory Visit: Payer: Self-pay | Admitting: Radiation Oncology

## 2016-12-08 ENCOUNTER — Telehealth: Payer: Self-pay | Admitting: *Deleted

## 2016-12-08 DIAGNOSIS — R300 Dysuria: Secondary | ICD-10-CM

## 2016-12-08 MED ORDER — SULFAMETHOXAZOLE-TRIMETHOPRIM 800-160 MG PO TABS: 1.0000 | ORAL_TABLET | Freq: Two times a day (BID) | ORAL | 0 refills | Status: AC

## 2016-12-08 NOTE — Addendum Note (Signed)
Encounter addended by: Heywood Footman, RN on: 12/08/2016  8:25 AM<BR>    Actions taken: Charge Capture section accepted

## 2016-12-09 ENCOUNTER — Other Ambulatory Visit: Payer: Self-pay | Admitting: Radiation Oncology

## 2016-12-09 ENCOUNTER — Telehealth: Payer: Self-pay | Admitting: *Deleted

## 2016-12-09 ENCOUNTER — Telehealth: Payer: Self-pay | Admitting: Radiation Oncology

## 2016-12-09 DIAGNOSIS — C7931 Secondary malignant neoplasm of brain: Secondary | ICD-10-CM

## 2016-12-09 NOTE — Telephone Encounter (Signed)
Left message with Courtney/Radiology/Scheduling to see if whole body PET can be moved to sooner that 12/16/16.

## 2016-12-09 NOTE — Telephone Encounter (Signed)
Received voicemail message from patient's daughter, Amy Little, requesting a return call. Phoned Amy back. She confirms her father's antibiotics have been picked up. She inquired about a handicap placard for her father. Explained that they could pick up the completed form after noon today and take to the [facility]. She verbalized understanding and express appreciation for the return call.

## 2016-12-09 NOTE — Addendum Note (Signed)
Addended by: Truitt Merle on: 12/09/2016 12:55 PM   Modules accepted: Orders

## 2016-12-10 DIAGNOSIS — Z515 Encounter for palliative care: Secondary | ICD-10-CM | POA: Insufficient documentation

## 2016-12-10 DIAGNOSIS — G939 Disorder of brain, unspecified: Secondary | ICD-10-CM | POA: Insufficient documentation

## 2016-12-10 DIAGNOSIS — Z7189 Other specified counseling: Secondary | ICD-10-CM | POA: Insufficient documentation

## 2016-12-10 LAB — URINE CULTURE

## 2016-12-10 MED ORDER — CIPROFLOXACIN HCL 500 MG PO TABS: 500.0000 mg | ORAL_TABLET | Freq: Two times a day (BID) | ORAL | 0 refills | Status: AC

## 2016-12-11 ENCOUNTER — Telehealth: Payer: Self-pay | Admitting: Urology

## 2016-12-11 NOTE — Progress Notes (Signed)
Thank you, Ashlyn.

## 2016-12-11 NOTE — Telephone Encounter (Signed)
I spoke with the patient's daughter, Amy, this morning who has confirmed that she has picked up the Rx for Cipro.  I informed her that they should d/c the Septra abx that was started on 12/08/16 and begin the Cipro for the full 7 days as prescribed.  She states her understanding and compliance.   I also informed her that we are working to coordinate a consult with Dr. Herschel Senegal (neurosurgeon in [place]) and she reports that she has already been in communication with Manuela Schwartz and was provided an appointment for Wednesday, 12/16/16.  She received a call from Dr. Megan Salon office this morning and was offered a sooner appointment with Dr. Herschel Senegal on Monday, 12/14/16 which she gratefully accepted.    She also inquired about the scheduled PET scan for Wednesday, 12/16/16 which is currently scheduled to be completed in [place] and she was wondering if this could potentially be completed in [place] for convenience.  I advised that this would be completely acceptable from our standpoint but that she would need to discuss this with Dr. Herschel Senegal on Monday so that he could place the order for PET at a trusted facility in [place], especially if he will be taking over the neurosurgical care of this patient.  She will not cancel the scheduled PET in [facility] until she has confirmation that Dr. Herschel Senegal is comfortable and willing to arrange for the scan to be completed in [place].  She was incredibly appreciate for the call and information and gives many compliments to the entire team for the care provided to date.   Nicholos Johns, PA-C

## 2016-12-14 DIAGNOSIS — C711 Malignant neoplasm of frontal lobe: Secondary | ICD-10-CM | POA: Diagnosis not present

## 2016-12-14 DIAGNOSIS — G20 Parkinson's disease: Secondary | ICD-10-CM | POA: Diagnosis not present

## 2016-12-15 DIAGNOSIS — D688 Other specified coagulation defects: Secondary | ICD-10-CM | POA: Diagnosis not present

## 2016-12-15 DIAGNOSIS — D499 Neoplasm of unspecified behavior of unspecified site: Secondary | ICD-10-CM | POA: Diagnosis not present

## 2016-12-15 DIAGNOSIS — C7931 Secondary malignant neoplasm of brain: Secondary | ICD-10-CM | POA: Diagnosis not present

## 2016-12-15 DIAGNOSIS — Z79899 Other long term (current) drug therapy: Secondary | ICD-10-CM | POA: Diagnosis not present

## 2016-12-16 ENCOUNTER — Ambulatory Visit (HOSPITAL_COMMUNITY): Payer: Medicare Other

## 2016-12-16 DIAGNOSIS — Z515 Encounter for palliative care: Secondary | ICD-10-CM | POA: Diagnosis not present

## 2016-12-16 DIAGNOSIS — C7931 Secondary malignant neoplasm of brain: Secondary | ICD-10-CM | POA: Diagnosis not present

## 2016-12-16 DIAGNOSIS — R4182 Altered mental status, unspecified: Secondary | ICD-10-CM | POA: Diagnosis not present

## 2016-12-16 DIAGNOSIS — Z01810 Encounter for preprocedural cardiovascular examination: Secondary | ICD-10-CM | POA: Diagnosis not present

## 2016-12-16 DIAGNOSIS — Z781 Physical restraint status: Secondary | ICD-10-CM | POA: Diagnosis not present

## 2016-12-16 DIAGNOSIS — R451 Restlessness and agitation: Secondary | ICD-10-CM | POA: Diagnosis not present

## 2016-12-16 DIAGNOSIS — T426X5A Adverse effect of other antiepileptic and sedative-hypnotic drugs, initial encounter: Secondary | ICD-10-CM | POA: Diagnosis not present

## 2016-12-16 DIAGNOSIS — Z79899 Other long term (current) drug therapy: Secondary | ICD-10-CM | POA: Diagnosis not present

## 2016-12-16 DIAGNOSIS — Z9889 Other specified postprocedural states: Secondary | ICD-10-CM | POA: Diagnosis not present

## 2016-12-16 DIAGNOSIS — C711 Malignant neoplasm of frontal lobe: Secondary | ICD-10-CM | POA: Diagnosis not present

## 2016-12-16 DIAGNOSIS — C719 Malignant neoplasm of brain, unspecified: Secondary | ICD-10-CM | POA: Diagnosis not present

## 2016-12-16 DIAGNOSIS — Z9181 History of falling: Secondary | ICD-10-CM | POA: Diagnosis not present

## 2016-12-16 DIAGNOSIS — D496 Neoplasm of unspecified behavior of brain: Secondary | ICD-10-CM | POA: Diagnosis not present

## 2016-12-16 DIAGNOSIS — Z66 Do not resuscitate: Secondary | ICD-10-CM | POA: Diagnosis not present

## 2016-12-16 DIAGNOSIS — Z86011 Personal history of benign neoplasm of the brain: Secondary | ICD-10-CM | POA: Diagnosis not present

## 2016-12-16 DIAGNOSIS — R402413 Glasgow coma scale score 13-15, at hospital admission: Secondary | ICD-10-CM | POA: Diagnosis not present

## 2016-12-16 DIAGNOSIS — G9389 Other specified disorders of brain: Secondary | ICD-10-CM | POA: Diagnosis not present

## 2016-12-16 DIAGNOSIS — G9349 Other encephalopathy: Secondary | ICD-10-CM | POA: Diagnosis not present

## 2016-12-16 DIAGNOSIS — G4700 Insomnia, unspecified: Secondary | ICD-10-CM | POA: Diagnosis present

## 2016-12-16 DIAGNOSIS — Z7401 Bed confinement status: Secondary | ICD-10-CM | POA: Diagnosis not present

## 2016-12-16 DIAGNOSIS — T428X5A Adverse effect of antiparkinsonism drugs and other central muscle-tone depressants, initial encounter: Secondary | ICD-10-CM | POA: Diagnosis not present

## 2016-12-16 DIAGNOSIS — Z888 Allergy status to other drugs, medicaments and biological substances status: Secondary | ICD-10-CM | POA: Diagnosis not present

## 2016-12-16 DIAGNOSIS — F05 Delirium due to known physiological condition: Secondary | ICD-10-CM | POA: Diagnosis not present

## 2016-12-21 ENCOUNTER — Encounter: Payer: Self-pay | Admitting: *Deleted

## 2016-12-21 NOTE — Progress Notes (Signed)
[place] Psychosocial Distress Screening Clinical Social Work  Clinical Social Work was referred by distress screening protocol.  The patient scored a 7 on the Psychosocial Distress Thermometer which indicates moderate distress. Clinical Social Worker contacted patients daughter as requested to assess for distress and other psychosocial needs.  CSW left a message for patients daughter offering support, and information on the support team and support services at [facility].  CSw encouraged patient and family to call with needs or concerns.   ONCBCN DISTRESS SCREENING 12/07/2016  Screening Type Initial Screening  Distress experienced in past week (1-10) 7  Family Problem type Partner  Emotional problem type Depression;Nervousness/Anxiety;Adjusting to illness  Information Concerns Type Lack of info about diagnosis;Lack of info about treatment;Lack of info about complementary therapy choices  Physical Problem type Pain;Getting around;Bathing/dressing  Physician notified of physical symptoms Yes  Referral to clinical psychology No  Referral to clinical social work No  Referral to dietition No  Referral to financial advocate No  Referral to support programs No  Referral to palliative care No;Yes  Other Call [place] [phone], pt & wife live alone, living here no resources, wife in early stages of dementia/memory loss, gets confused with appts and medication schedule    Johnnye Lana, MSW, LCSW, OSW-C Clinical Social Worker [place] [phone]

## 2016-12-23 ENCOUNTER — Encounter: Payer: Self-pay | Admitting: Urology

## 2016-12-23 NOTE — Progress Notes (Signed)
I spoke with the patient's daughter, Amy, today to follow up regarding Troy Cox recent consult with Dr. Herschel Senegal in [place], [place] on 12/14/16.  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  He has been transferred to [facility] [place], [place]. She was very appreciative of the call and follow up. I advised that we are happy to continue to help in any way that we can. Ailene Ards

## 2017-01-08 DEATH — deceased

## 2017-06-01 ENCOUNTER — Ambulatory Visit: Payer: Self-pay | Admitting: Occupational Therapy

## 2017-06-01 ENCOUNTER — Ambulatory Visit: Payer: Self-pay | Admitting: Physical Therapy

## 2017-06-01 ENCOUNTER — Ambulatory Visit: Payer: Self-pay

## 2018-02-16 IMAGING — MR MR HEAD WO/W CM
13 of 17 series · 35 of 48 positions shown · IV contrast (multihance)
Comparison: CT HEAD December 03, 2016 at 7813 hours

CLINICAL DATA: Fell head first into fire pit today. Pain. History
of Parkinson's disease. Follow-up RIGHT frontal lobe mass.

EXAM:
MRI HEAD WITHOUT AND WITH CONTRAST
TECHNIQUE: Multiplanar, multiecho pulse sequences of the brain and surrounding
structures were obtained without and with intravenous contrast.
CONTRAST:  19mL MULTIHANCE GADOBENATE DIMEGLUMINE 529 MG/ML IV SOLN

[Series 3: DWI · axial · 3.0mm · 1.09mm/px · z∈[-26,+115]mm · 7 of 96 slices shown (1 of 6)]
[im 1/96]
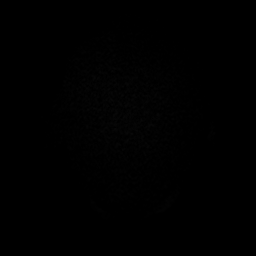
[im 16/96]
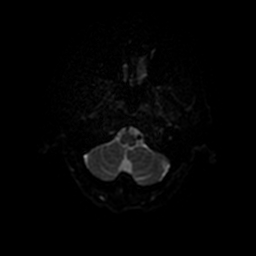
[im 32/96]
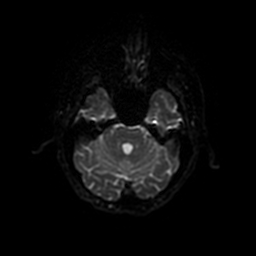
[im 48/96]
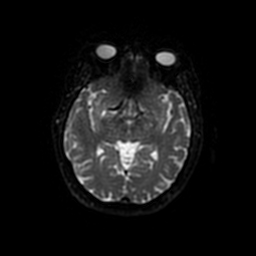
[im 64/96]
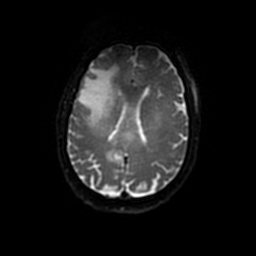
[im 80/96]
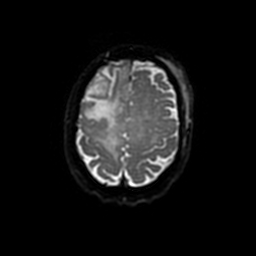
[im 96/96]
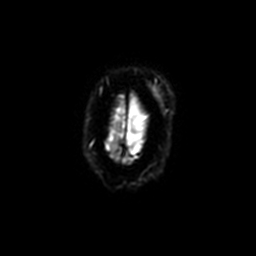

[Series 4: T1 · sagittal · 5.0mm · 0.47mm/px · 1 of 24 slices shown]
[im 1/24]
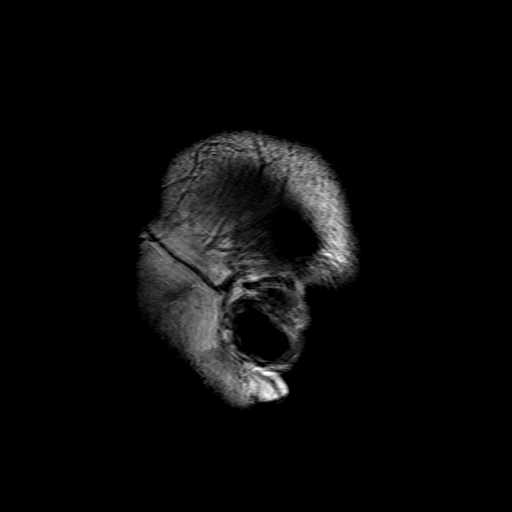

[Series 5: DWI · coronal · 5.0mm · 1.09mm/px · 4 of 64 slices shown (2 of 6)]
[im 1/64]
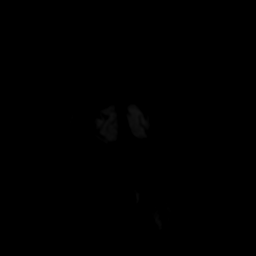
[im 22/64]
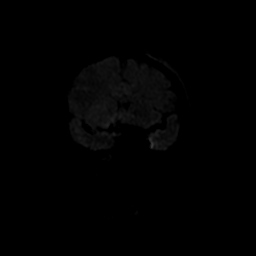
[im 43/64]
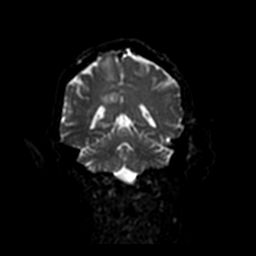
[im 64/64]
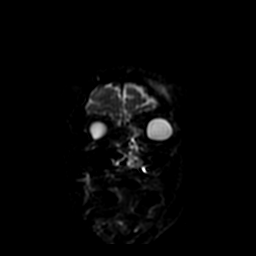

[Series 6: T2 · axial · 5.0mm · 0.43mm/px · z∈[-20,+123]mm · 2 of 25 slices shown]
[im 1/25]
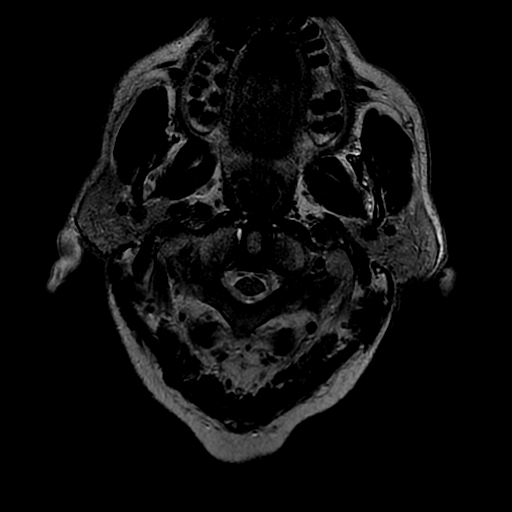
[im 25/25]
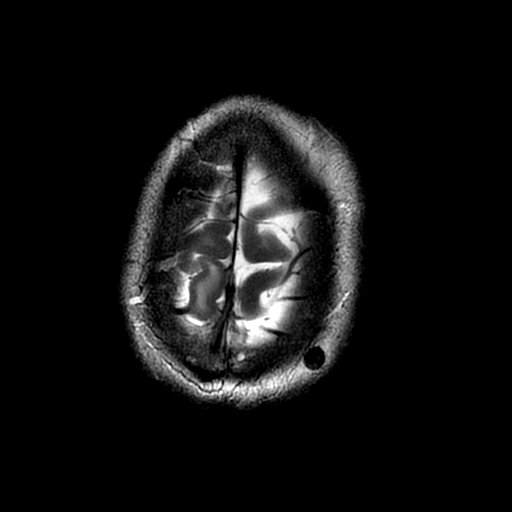

[Series 7: FLAIR · axial · 5.0mm · 0.43mm/px · z∈[-20,+123]mm · 2 of 25 slices shown]
[im 1/25]
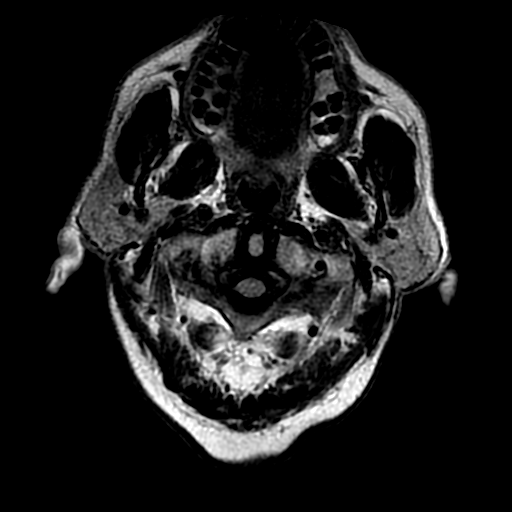
[im 25/25]
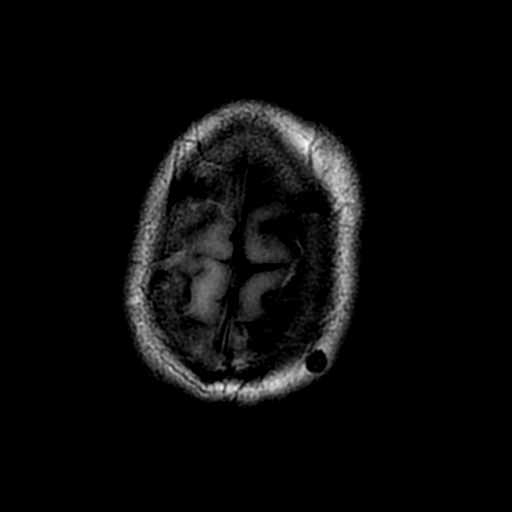

[Series 10: T2 post-contrast · coronal · 5.0mm · 0.45mm/px · 2 of 27 slices shown (1 of 2)]
[im 1/27]
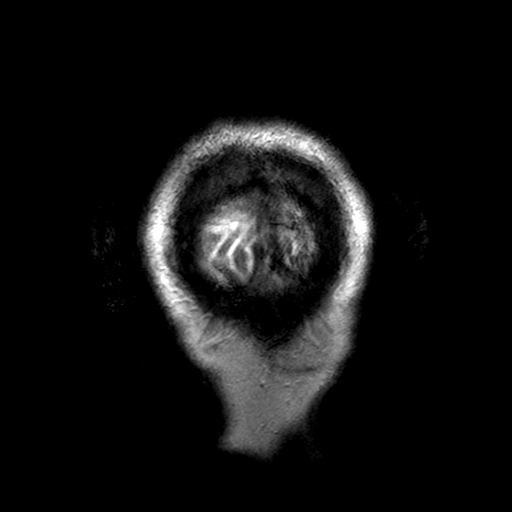
[im 27/27]
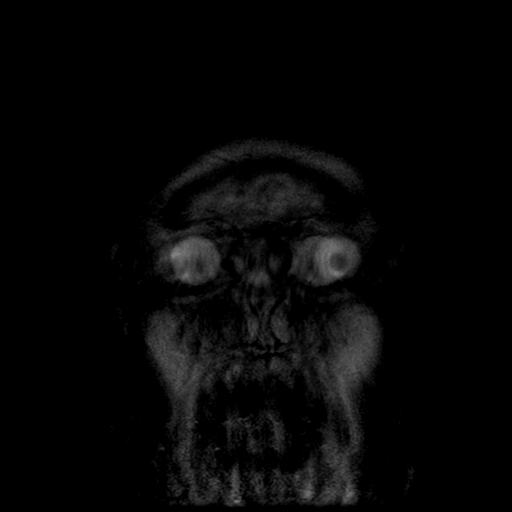

[Series 12: T2 post-contrast · coronal · 5.0mm · 0.90mm/px · 2 of 27 slices shown (2 of 2)]
[im 1/27]
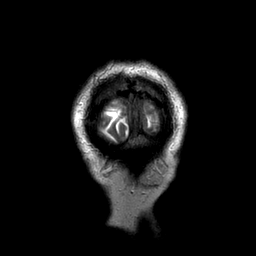
[im 27/27]
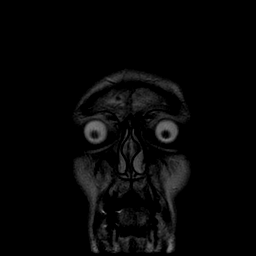

[Series 13: T1 post-contrast · coronal · 5.0mm · 0.45mm/px · 2 of 27 slices shown (1 of 2)]
[im 1/27]
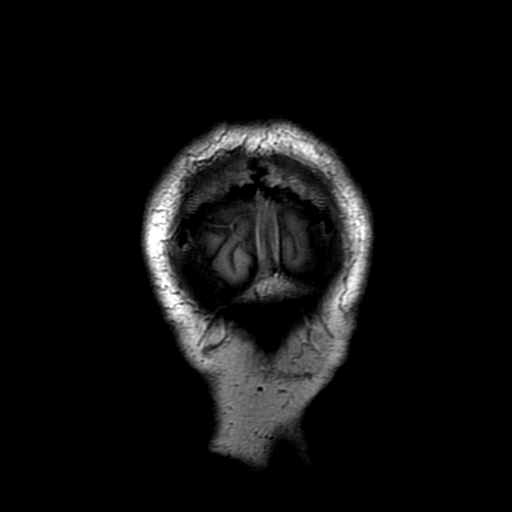
[im 27/27]
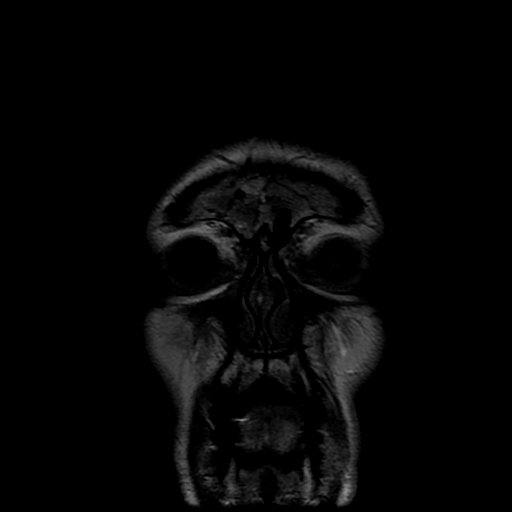

[Series 14: T1 post-contrast · sagittal · 5.0mm · 0.47mm/px · 2 of 24 slices shown (2 of 2)]
[im 1/24]
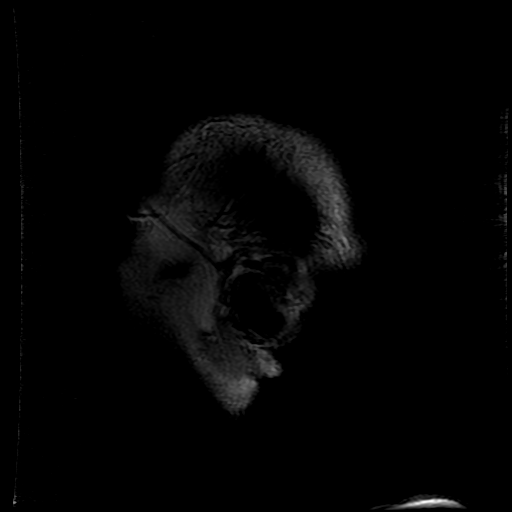
[im 24/24]
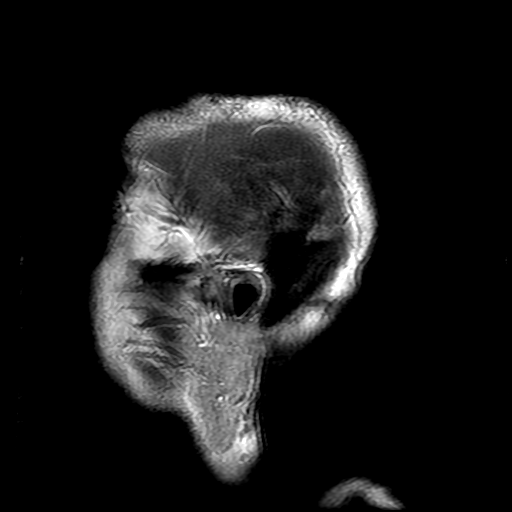

[Series 300: DWI · axial · 3.0mm · 1.09mm/px · z∈[-26,+115]mm · 3 of 48 slices shown (3 of 6)]
[im 1/48]
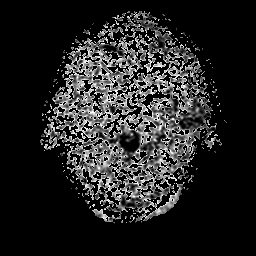
[im 24/48]
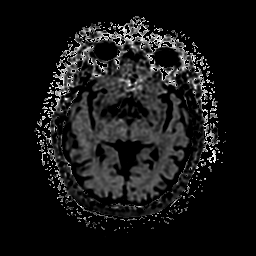
[im 48/48]
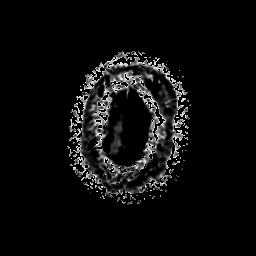

[Series 301: DWI · axial · 3.0mm · 1.09mm/px · z∈[-26,+115]mm · 3 of 48 slices shown (4 of 6)]
[im 1/48]
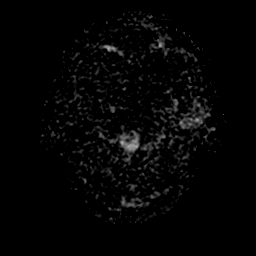
[im 24/48]
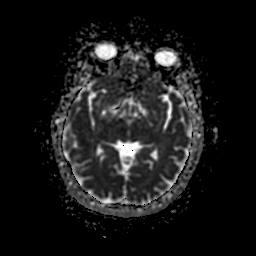
[im 48/48]
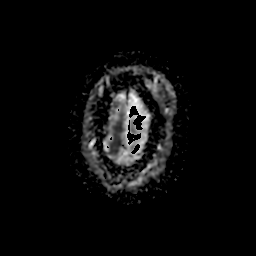

[Series 302: DWI · axial · 3.0mm · 1.09mm/px · z∈[-26,+115]mm · 3 of 46 slices shown (5 of 6)]
[im 1/46]
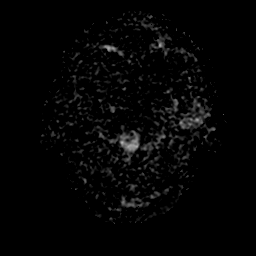
[im 23/46]
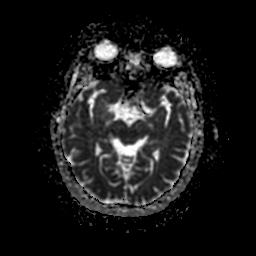
[im 46/46]
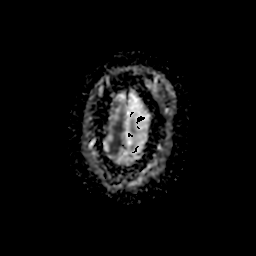

[Series 500: DWI · coronal · 5.0mm · 1.09mm/px · 2 of 32 slices shown (6 of 6)]
[im 1/32]
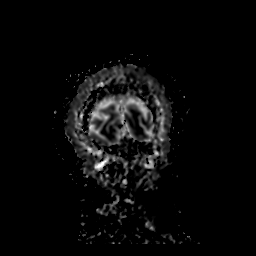
[im 32/32]
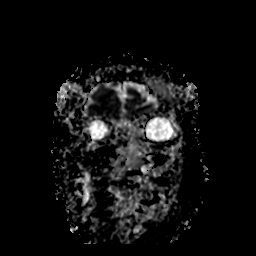

[35 of 48 positions shown; findings below may reference images not displayed]

FINDINGS: No discernible contrast enhancement on post gadolinium sequences.
Sequences are mildly or moderately motion degraded.

INTRACRANIAL CONTENTS: Faint reduced diffusion and low ADC values
associated with 2.1 x 2.7 cm RIGHT frontal lobe mass effect
gray-white matter junction and RIGHT mesial parietal lobe 11 x 11 mm
mass. Extensive surrounding FLAIR T2 hyperintense of vasogenic edema
which extends into LEFT frontoparietal lobes and, along the corpus
callosum to LEFT parietal lobe. Ventricles and sulci are overall
normal for patient's age. Local sulcal effacement without midline
shift.

VASCULAR: Normal major intracranial vascular flow voids present at
skull base.

SKULL AND UPPER CERVICAL SPINE: No abnormal sellar expansion. No
suspicious calvarial bone marrow signal. Craniocervical junction
maintained. Moderate LEFT frontal scalp hematoma.

SINUSES/ORBITS: Mild paranasal sinus mucosal thickening.The included
ocular globes and orbital contents are non-suspicious.

OTHER: None.
IMPRESSION: No significant contrast on post gadolinium sequences which may be
secondary to contrast extravasation or, recent iron infusion, less
likely.

2.1 x 2.7 cm RIGHT frontal and 11 x 11 mm RIGHT parietal lobe masses
concerning for metastasis. Extensive vasogenic edema extending to
contralateral cerebrum concerning for additional small metastatic
deposits. Findings less likely represent primary multicentric brain
tumor with infiltrative component. Recommend correlation with recent
transfusion and, repeat post gadolinium sequences with contrast.

Acute findings discussed with and reconfirmed by Dr.KATYA on
12/03/2016 at [DATE].
